# Patient Record
Sex: Female | Born: 1950 | Race: White | Hispanic: No | Marital: Married | Smoking: Current every day smoker
Health system: Southern US, Community
[De-identification: ages and names within clinical notes are randomized; demographics above are authoritative.]

## PROBLEM LIST (undated history)

## (undated) DIAGNOSIS — Z9889 Other specified postprocedural states: Secondary | ICD-10-CM

## (undated) DIAGNOSIS — R112 Nausea with vomiting, unspecified: Secondary | ICD-10-CM

## (undated) DIAGNOSIS — I10 Essential (primary) hypertension: Secondary | ICD-10-CM

## (undated) DIAGNOSIS — G709 Myoneural disorder, unspecified: Secondary | ICD-10-CM

## (undated) DIAGNOSIS — J189 Pneumonia, unspecified organism: Secondary | ICD-10-CM

## (undated) HISTORY — PX: TONSILLECTOMY: SUR1361

## (undated) HISTORY — PX: APPENDECTOMY: SHX54

---

## 1972-01-31 HISTORY — PX: TUBAL LIGATION: SHX77

## 1974-01-30 HISTORY — PX: ABDOMINAL HYSTERECTOMY: SHX81

## 1997-10-08 ENCOUNTER — Encounter: Payer: Self-pay | Admitting: Family Medicine

## 1997-10-08 ENCOUNTER — Ambulatory Visit (HOSPITAL_COMMUNITY): Admission: RE | Admit: 1997-10-08 | Discharge: 1997-10-08 | Payer: Self-pay | Admitting: Family Medicine

## 1997-10-16 ENCOUNTER — Encounter: Payer: Self-pay | Admitting: Family Medicine

## 1997-10-16 ENCOUNTER — Ambulatory Visit (HOSPITAL_COMMUNITY): Admission: RE | Admit: 1997-10-16 | Discharge: 1997-10-16 | Payer: Self-pay | Admitting: Family Medicine

## 1998-07-15 ENCOUNTER — Ambulatory Visit (HOSPITAL_BASED_OUTPATIENT_CLINIC_OR_DEPARTMENT_OTHER): Admission: RE | Admit: 1998-07-15 | Discharge: 1998-07-15 | Payer: Self-pay | Admitting: Orthopedic Surgery

## 1999-01-31 HISTORY — PX: CHOLECYSTECTOMY: SHX55

## 1999-06-09 ENCOUNTER — Encounter: Payer: Self-pay | Admitting: Family Medicine

## 1999-06-09 ENCOUNTER — Encounter: Payer: Self-pay | Admitting: General Surgery

## 1999-06-09 ENCOUNTER — Inpatient Hospital Stay (HOSPITAL_COMMUNITY): Admission: EM | Admit: 1999-06-09 | Discharge: 1999-06-10 | Payer: Self-pay | Admitting: Emergency Medicine

## 1999-06-09 ENCOUNTER — Encounter (INDEPENDENT_AMBULATORY_CARE_PROVIDER_SITE_OTHER): Payer: Self-pay | Admitting: Specialist

## 1999-06-09 ENCOUNTER — Ambulatory Visit (HOSPITAL_COMMUNITY): Admission: RE | Admit: 1999-06-09 | Discharge: 1999-06-09 | Payer: Self-pay | Admitting: Family Medicine

## 1999-06-24 ENCOUNTER — Encounter: Payer: Self-pay | Admitting: General Surgery

## 1999-06-24 ENCOUNTER — Encounter: Admission: RE | Admit: 1999-06-24 | Discharge: 1999-06-24 | Payer: Self-pay | Admitting: General Surgery

## 1999-10-18 ENCOUNTER — Encounter: Payer: Self-pay | Admitting: Family Medicine

## 1999-10-18 ENCOUNTER — Encounter: Admission: RE | Admit: 1999-10-18 | Discharge: 1999-10-18 | Payer: Self-pay | Admitting: Family Medicine

## 2000-01-31 HISTORY — PX: BREAST SURGERY: SHX581

## 2001-01-30 HISTORY — PX: BREAST EXCISIONAL BIOPSY: SUR124

## 2001-04-23 ENCOUNTER — Encounter: Payer: Self-pay | Admitting: Family Medicine

## 2001-04-23 ENCOUNTER — Encounter: Admission: RE | Admit: 2001-04-23 | Discharge: 2001-04-23 | Payer: Self-pay | Admitting: Family Medicine

## 2001-04-23 ENCOUNTER — Encounter (INDEPENDENT_AMBULATORY_CARE_PROVIDER_SITE_OTHER): Payer: Self-pay | Admitting: Specialist

## 2001-04-26 ENCOUNTER — Encounter (INDEPENDENT_AMBULATORY_CARE_PROVIDER_SITE_OTHER): Payer: Self-pay | Admitting: *Deleted

## 2001-04-26 ENCOUNTER — Ambulatory Visit (HOSPITAL_BASED_OUTPATIENT_CLINIC_OR_DEPARTMENT_OTHER): Admission: RE | Admit: 2001-04-26 | Discharge: 2001-04-26 | Payer: Self-pay | Admitting: General Surgery

## 2001-04-30 HISTORY — PX: BREAST BIOPSY: SHX20

## 2002-03-21 ENCOUNTER — Encounter (INDEPENDENT_AMBULATORY_CARE_PROVIDER_SITE_OTHER): Payer: Self-pay

## 2002-03-21 ENCOUNTER — Ambulatory Visit (HOSPITAL_BASED_OUTPATIENT_CLINIC_OR_DEPARTMENT_OTHER): Admission: RE | Admit: 2002-03-21 | Discharge: 2002-03-21 | Payer: Self-pay | Admitting: General Surgery

## 2002-04-29 ENCOUNTER — Encounter: Payer: Self-pay | Admitting: General Surgery

## 2002-04-29 ENCOUNTER — Encounter: Admission: RE | Admit: 2002-04-29 | Discharge: 2002-04-29 | Payer: Self-pay | Admitting: General Surgery

## 2003-01-31 HISTORY — PX: BACK SURGERY: SHX140

## 2003-02-03 ENCOUNTER — Ambulatory Visit (HOSPITAL_COMMUNITY): Admission: RE | Admit: 2003-02-03 | Discharge: 2003-02-03 | Payer: Self-pay | Admitting: Orthopedic Surgery

## 2003-02-03 ENCOUNTER — Ambulatory Visit (HOSPITAL_BASED_OUTPATIENT_CLINIC_OR_DEPARTMENT_OTHER): Admission: RE | Admit: 2003-02-03 | Discharge: 2003-02-03 | Payer: Self-pay | Admitting: Orthopedic Surgery

## 2003-07-26 ENCOUNTER — Ambulatory Visit (HOSPITAL_COMMUNITY): Admission: RE | Admit: 2003-07-26 | Discharge: 2003-07-26 | Payer: Self-pay | Admitting: Family Medicine

## 2003-12-22 ENCOUNTER — Encounter: Admission: RE | Admit: 2003-12-22 | Discharge: 2003-12-22 | Payer: Self-pay | Admitting: Neurological Surgery

## 2004-01-14 ENCOUNTER — Inpatient Hospital Stay (HOSPITAL_COMMUNITY): Admission: RE | Admit: 2004-01-14 | Discharge: 2004-01-15 | Payer: Self-pay | Admitting: Neurological Surgery

## 2004-01-29 ENCOUNTER — Inpatient Hospital Stay (HOSPITAL_COMMUNITY): Admission: AD | Admit: 2004-01-29 | Discharge: 2004-02-04 | Payer: Self-pay | Admitting: Neurological Surgery

## 2004-03-15 ENCOUNTER — Encounter: Admission: RE | Admit: 2004-03-15 | Discharge: 2004-03-15 | Payer: Self-pay | Admitting: Neurological Surgery

## 2004-03-19 ENCOUNTER — Encounter: Admission: RE | Admit: 2004-03-19 | Discharge: 2004-03-19 | Payer: Self-pay | Admitting: Neurological Surgery

## 2004-04-22 ENCOUNTER — Encounter: Admission: RE | Admit: 2004-04-22 | Discharge: 2004-04-22 | Payer: Self-pay | Admitting: General Surgery

## 2004-05-17 ENCOUNTER — Encounter: Admission: RE | Admit: 2004-05-17 | Discharge: 2004-05-17 | Payer: Self-pay | Admitting: Neurological Surgery

## 2004-10-10 ENCOUNTER — Encounter: Admission: RE | Admit: 2004-10-10 | Discharge: 2004-10-10 | Payer: Self-pay | Admitting: Neurological Surgery

## 2004-10-14 ENCOUNTER — Encounter: Admission: RE | Admit: 2004-10-14 | Discharge: 2004-10-14 | Payer: Self-pay | Admitting: Neurological Surgery

## 2005-01-27 ENCOUNTER — Encounter: Admission: RE | Admit: 2005-01-27 | Discharge: 2005-01-27 | Payer: Self-pay | Admitting: Neurological Surgery

## 2005-02-15 ENCOUNTER — Inpatient Hospital Stay (HOSPITAL_COMMUNITY): Admission: RE | Admit: 2005-02-15 | Discharge: 2005-02-17 | Payer: Self-pay | Admitting: Neurological Surgery

## 2005-03-28 ENCOUNTER — Encounter: Admission: RE | Admit: 2005-03-28 | Discharge: 2005-03-28 | Payer: Self-pay | Admitting: Neurological Surgery

## 2005-05-01 ENCOUNTER — Encounter: Admission: RE | Admit: 2005-05-01 | Discharge: 2005-05-01 | Payer: Self-pay | Admitting: General Surgery

## 2005-06-20 ENCOUNTER — Encounter: Admission: RE | Admit: 2005-06-20 | Discharge: 2005-06-20 | Payer: Self-pay | Admitting: Neurological Surgery

## 2006-02-19 ENCOUNTER — Encounter: Admission: RE | Admit: 2006-02-19 | Discharge: 2006-02-19 | Payer: Self-pay | Admitting: Neurological Surgery

## 2006-06-05 ENCOUNTER — Encounter: Admission: RE | Admit: 2006-06-05 | Discharge: 2006-06-05 | Payer: Self-pay | Admitting: Family Medicine

## 2006-06-13 ENCOUNTER — Encounter: Admission: RE | Admit: 2006-06-13 | Discharge: 2006-06-13 | Payer: Self-pay | Admitting: Family Medicine

## 2006-07-21 ENCOUNTER — Encounter: Admission: RE | Admit: 2006-07-21 | Discharge: 2006-07-21 | Payer: Self-pay | Admitting: Neurological Surgery

## 2007-03-11 ENCOUNTER — Encounter: Admission: RE | Admit: 2007-03-11 | Discharge: 2007-03-11 | Payer: Self-pay | Admitting: Neurological Surgery

## 2007-03-22 ENCOUNTER — Encounter: Admission: RE | Admit: 2007-03-22 | Discharge: 2007-03-22 | Payer: Self-pay | Admitting: Neurological Surgery

## 2007-08-06 ENCOUNTER — Encounter: Admission: RE | Admit: 2007-08-06 | Discharge: 2007-08-06 | Payer: Self-pay | Admitting: Family Medicine

## 2007-08-26 ENCOUNTER — Encounter: Admission: RE | Admit: 2007-08-26 | Discharge: 2007-08-26 | Payer: Self-pay | Admitting: Anesthesiology

## 2007-10-31 ENCOUNTER — Encounter: Admission: RE | Admit: 2007-10-31 | Discharge: 2007-10-31 | Payer: Self-pay | Admitting: Neurological Surgery

## 2007-11-13 ENCOUNTER — Encounter: Admission: RE | Admit: 2007-11-13 | Discharge: 2007-11-13 | Payer: Self-pay | Admitting: Neurological Surgery

## 2008-09-08 ENCOUNTER — Encounter: Admission: RE | Admit: 2008-09-08 | Discharge: 2008-09-08 | Payer: Self-pay | Admitting: Neurological Surgery

## 2008-09-21 ENCOUNTER — Encounter: Admission: RE | Admit: 2008-09-21 | Discharge: 2008-09-21 | Payer: Self-pay | Admitting: Family Medicine

## 2008-10-29 ENCOUNTER — Ambulatory Visit (HOSPITAL_COMMUNITY): Admission: RE | Admit: 2008-10-29 | Discharge: 2008-10-30 | Payer: Self-pay | Admitting: Neurological Surgery

## 2008-12-07 ENCOUNTER — Encounter: Admission: RE | Admit: 2008-12-07 | Discharge: 2008-12-07 | Payer: Self-pay | Admitting: Neurological Surgery

## 2008-12-14 ENCOUNTER — Encounter: Admission: RE | Admit: 2008-12-14 | Discharge: 2008-12-14 | Payer: Self-pay | Admitting: Family Medicine

## 2009-02-08 ENCOUNTER — Encounter: Admission: RE | Admit: 2009-02-08 | Discharge: 2009-02-08 | Payer: Self-pay | Admitting: Neurological Surgery

## 2009-03-29 ENCOUNTER — Encounter: Admission: RE | Admit: 2009-03-29 | Discharge: 2009-03-29 | Payer: Self-pay | Admitting: Family Medicine

## 2009-09-23 ENCOUNTER — Encounter: Admission: RE | Admit: 2009-09-23 | Discharge: 2009-09-23 | Payer: Self-pay | Admitting: Family Medicine

## 2009-10-25 ENCOUNTER — Encounter: Admission: RE | Admit: 2009-10-25 | Discharge: 2009-10-25 | Payer: Self-pay | Admitting: Unknown Physician Specialty

## 2010-02-20 ENCOUNTER — Encounter: Payer: Self-pay | Admitting: Orthopedic Surgery

## 2010-03-15 ENCOUNTER — Other Ambulatory Visit: Payer: Self-pay | Admitting: Neurological Surgery

## 2010-03-15 DIAGNOSIS — M545 Low back pain, unspecified: Secondary | ICD-10-CM

## 2010-03-17 ENCOUNTER — Ambulatory Visit
Admission: RE | Admit: 2010-03-17 | Discharge: 2010-03-17 | Disposition: A | Discharge status: Home / Self Care | Payer: Self-pay | Source: Ambulatory Visit | Attending: Neurological Surgery | Admitting: Neurological Surgery

## 2010-03-17 DIAGNOSIS — M545 Low back pain, unspecified: Secondary | ICD-10-CM

## 2010-03-17 MED ORDER — GADOBENATE DIMEGLUMINE 529 MG/ML IV SOLN
17.0000 mL | Freq: Once | INTRAVENOUS | Status: AC | PRN
  Administered 2010-03-17: 17 mL via INTRAVENOUS

## 2010-05-06 LAB — DIFFERENTIAL
Basophils Absolute: 0 10*3/uL (ref 0.0–0.1)
Eosinophils Relative: 4 % (ref 0–5)
Lymphocytes Relative: 26 % (ref 12–46)

## 2010-05-06 LAB — PROTIME-INR: INR: 0.9 (ref 0.00–1.49)

## 2010-05-06 LAB — APTT: aPTT: 29 seconds (ref 24–37)

## 2010-05-06 LAB — COMPREHENSIVE METABOLIC PANEL
Albumin: 3.7 g/dL (ref 3.5–5.2)
BUN: 4 mg/dL — ABNORMAL LOW (ref 6–23)
Creatinine, Ser: 0.92 mg/dL (ref 0.4–1.2)
Total Bilirubin: 0.7 mg/dL (ref 0.3–1.2)
Total Protein: 6.5 g/dL (ref 6.0–8.3)

## 2010-05-06 LAB — CBC
Hemoglobin: 13.7 g/dL (ref 12.0–15.0)
MCHC: 33.9 g/dL (ref 30.0–36.0)

## 2010-06-17 NOTE — Op Note (Signed)
   NAME:  [name], [name]                       ACCOUNT NO.:  [id number]   MEDICAL RECORD NO.:  [id number]                   PATIENT TYPE:  AMB   LOCATION:  DSC                                  FACILITY:  [facility]   PHYSICIAN:  Rose Phi. Maple Hudson, M.D.                DATE OF BIRTH:  1950/07/12   DATE OF PROCEDURE:  03/21/2002  DATE OF DISCHARGE:                                 OPERATIVE REPORT   PREOPERATIVE DIAGNOSIS:  Sebaceous cyst of the right axilla.   POSTOPERATIVE DIAGNOSIS:  Sebaceous cyst of the right axilla.   PROCEDURE:  Excision of sebaceous cyst of the right axilla.   SURGEON:  Rose Phi. Maple Hudson, M.D.   ANESTHESIA:  Local.   DESCRIPTION OF PROCEDURE:  This patient had a painful, tender sebaceous cyst  of the right axilla.  We prepped and draped it and then under local  anesthesia I excised it.  Wound closed with 4-0 nylon.  Dressing applied.  The patient sent home doing well.                                               Rose Phi. Maple Hudson, M.D.    PRY/MEDQ  D:  03/21/2002  T:  03/21/2002  Job:  [id number]

## 2010-06-17 NOTE — Op Note (Signed)
[facility]  Patient:    [name], [name]                    MRN: [id number] Proc. Date: 06/09/99 Adm. Date:  [id number] Disc. Date: [id number] Attending:  Carson Myrtle CC:         Meredith Staggers, M.D.                           Operative Report  PREOPERATIVE DIAGNOSIS:  Acute cholecystolithiasis.  POSTOPERATIVE DIAGNOSIS:  Acute cholecystolithiasis.  PROCEDURE:  Laparoscopic cholecystectomy.  SURGEON:  Timothy E. Earlene Plater, M.D.  ASSISTANT:  Abigail Miyamoto, M.D.  ANESTHESIA:  CRNA-supervised, Lucille Passy, M.D.  INDICATIONS:  [name] was seen this afternoon.  She is a 61 year old Caucasian female referred by Dr. Arsenio Loader with acute abdominal pain. Laboratory data are normal.  Ultrasound shows a thickened gallbladder, acutely inflamed with pericholecystic fluid and stones in the gallbladder.  She agrees to proceed with a laparoscopic cholecystectomy.  DESCRIPTION OF PROCEDURE:  The patient was taken to the operating room. General endotracheal anesthesia was administered.  The abdomen was scrubbed, prepped, and draped in the usual fashion.  Marcaine, 0.5%, with epinephrine was used for each trocar site.  A vertical incision was made at the inferior aspect of the umbilicus.  The fascia was identified and opened vertically in the midline.  The peritoneum was entered without complication.  The ______ catheter was tied in place.  The abdomen was insufflated and camera was introduced.  The gallbladder was acutely inflamed and tense.  The remainder of the abdomen was grossly within normal limits.  A second 10-mm trocar was placed in the mid epigastrium and two 5-mm trocars in the right upper quadrant.  The gallbladder was grasped, punctured, and suctioned, and then placed on tension.  The adhesions of the duodenum and omentum were taken down bluntly without cautery.  The base of the gallbladder was dissected completely.  The  cystic duct was anterior; it was dissected out, triply clipped and divided.  Two branches of the cystic artery were dissected out, entering the gallbladder, triply clipped and divided.  The gallbladder was then removed from the liver bed without complication although there was considerable edema.  The bed was dry.  There was no blood or bile.  The irrigation was clear.  The gallbladder was removed through the infraumbilical incision, and that was tied closed.  Inspection was negative.  All irrigant seal, two instruments and trocars were removed.  Skin incision was closed with a 4-0 Monocryl.  Steri-Strips were applied.  She tolerated it well.  Final counts were correct, and she was removed to recovery room in good condition. DD:  06/09/99 TD:  06/13/99 Job: [id number]

## 2010-06-17 NOTE — Op Note (Signed)
NAME:  [name], [name]                       ACCOUNT NO.:  [id number]   MEDICAL RECORD NO.:  [id number]                   PATIENT TYPE:  AMB   LOCATION:  DSC                                  FACILITY:  [facility]   PHYSICIAN:  Robert A. Thurston Hole, M.D.              DATE OF BIRTH:  [birth year]   DATE OF PROCEDURE:  02/03/2003  DATE OF DISCHARGE:                                 OPERATIVE REPORT   PREOPERATIVE DIAGNOSES:  Left shoulder partial rotator cuff tear with  impingement and acromioclavicular joint arthropathy.   POSTOPERATIVE DIAGNOSES:  Left shoulder partial rotator cuff tear with  impingement and acromioclavicular joint arthropathy.   PROCEDURE:  1. Left shoulder examination under anesthesia, followed by an arthroscopic     partial rotator cuff tear debridement.  2. Left shoulder subacromial decompression.  3. Left shoulder distal clavicle excision.   SURGEON:  Elana Alm. Thurston Hole, M.D.   ASSISTANT:  Julien Girt, P.A.   ANESTHESIA:  General.   OPERATIVE TIME:  Was 40 minutes.   COMPLICATIONS:  None.   INDICATIONS FOR PROCEDURE:  The patient is a 60 year old woman who has had  10-12 months of increasing left shoulder pain with an examination and MRI  documenting a partial rotator cuff tear with impingement and AC joint  arthropathy.  She has failed conservative care and is now to undergo an  arthroscopy.   DESCRIPTION OF PROCEDURE:  The patient was brought to the operating room on  February 03, 2003, after an inter-Scalene block had been placed in the  holding room.  She was placed on the operating room table in the supine  position.  After being placed under general anesthesia, her left shoulder  was examined under anesthesia.  She had a full range of motion and her  shoulder was stable to ligamentous exam.  She was then placed in the beach  chair position, and her shoulder and arm were prepped using sterile DuraPrep  and draped using a sterile technique.  Originally  through a posterior  arthroscopic portal, the arthroscope with a pump attached was placed, and  through an anterior an arthroscopic probe was placed.  On initial inspection  the articular cartilage and glenohumeral joint were intact.  The anterior  and posterior labrum were intact.  The inferior labrum and anterior inferior  glenohumeral ligament complex was intact.  The superior labrum and biceps  tendon anchor were intact, and the biceps tendon was intact.  The rotator  cuff showed a small partial tear, 20% of the undersurface of the  supraspinatus which was debrided.  The rest of the rotator cuff was intact.  The inferior capsular recess was free of pathology.  The subacromial space  was entered and a lateral arthroscopic portal was made.  Moderately  thickened bursitis was resected.  The rotator cuff underneath this was  thickened and inflamed, but no evidence of tear.  Impingement was noted, and  a subacromial decompression was carried out as well as a CA  ligament  release.  The [facility] joint showed significant spurring and synovitis and  inflammation, and the distal 5.0 to 6.0 mm of the clavicle was resected with  a 6.0 mm bur.  After this was done, the shoulder could be brought through a  full range of motion with no impingement on the rotator cuff.  At this point  it was felt that all pathology had been satisfactorily addressed.  The  instruments were removed.  The portals were closed with #3-0 nylon suture  and injected with 0.25% Marcaine with epinephrine.  Sterile dressings and a  sling were applied.  The patient was awakened and taken to the recovery room in stable condition.   FOLLOW-UP CARE:  The patient will be followed as an outpatient on Mepergan  Fortis and Naprosyn, with early physical therapy.  I will see her back in  the office in one week for sutures out and followup.                                               Robert A. Thurston Hole, M.D.    RAW/MEDQ  D:  02/03/2003   T:  02/03/2003  Job:  [id number]

## 2010-06-17 NOTE — Op Note (Signed)
[name], [name] NO.:  [id number]   MEDICAL RECORD NO.:  [id number]          PATIENT TYPE:  INP   LOCATION:  2899                         FACILITY:  [facility]   PHYSICIAN:  Tia Alert, MD     DATE OF BIRTH:  1950/07/01   DATE OF PROCEDURE:  01/14/2004  DATE OF DISCHARGE:                                 OPERATIVE REPORT   PREOPERATIVE DIAGNOSES:  1.  Degenerative joint disease, L4-5.  2.  Lumbar spinal stenosis, L4-5.  3.  Back pain.  4.  Left leg pain.   POSTOPERATIVE DIAGNOSES:  1.  Degenerative joint disease, L4-5.  2.  Lumbar spinal stenosis, L4-5.  3.  Back pain.  4.  Left leg pain.   PROCEDURES:  1.  Decompressive lumbar hemilaminectomy, hemifacetectomy, L4-5 on the left,      with sublaminar decompression for spinal stenosis.  2.  Transforaminal lumbar interbody fusion, L4-5, utilizing a 12 x 25 mm      PEEK interbody cage packed with local autograft and Grafton putty for      degenerative joint disease.  3.  Posterolateral fusion, L4-5 utilizing Grafton putty and local autograft.  4.  Nonsegmental fixation, L4-5, utilizing the Nuvasive pedicle screw system      on the left and a trans-facet screw on the right.   SURGEON:  Tia Alert, M.D.   ASSISTANT:  Reinaldo Meeker, M.D.   ANESTHESIA:  General endotracheal.   COMPLICATIONS:  None apparent.   INDICATION FOR PROCEDURE:  [name] is a 60 year old white female who  was referred to the neurosurgery clinic with complaints of severe back pain.  She also had left leg pain.  She had an MRI and a CT scan, which showed  spinal stenosis at L4-5 with degenerative joint disease.  She had tried  medical management for quite some time without significant relief.  We  discussed the options.  One option was a transforaminal lumbar interbody  fusion with nonsegmental instrumentation.  She had failed all other medical  management.  She understood the risks, the benefits, and the expected  outcome of the procedure and wished to proceed.   DESCRIPTION OF PROCEDURE:  The patient was taken to the operating room and  after the induction of adequate generalized endotracheal anesthesia, she was  rolled into the prone position on the Wilson frame and all pressure points  were padded.  Her lumbar region was prepped with the DuraPrep and then  draped in the usual sterile fashion.  Local anesthesia 10 mL was injected  and then a dorsal midline incision was made and carried down to the  lumbosacral fascia.  The fascia was opened and the paraspinous musculature  was taken down in a subperiosteal fashion to expose the L4-5 interspace  along with the facets.  The transverse processes were identified on the  patient's left side.  Intraoperative fluoroscopy confirmed our level, and  then the spinous process was removed with a Leksell rongeur and then a  hemilaminectomy, hemifacetectomy, and foraminotomy was performed at L4-5 on  the left side.  A small dural  tear was created underneath the superior part  of the lamina at L5, and this was sewn primarily with a 6-0 Prolene suture.  We then finished the decompression.  The L4 and L5 nerve roots were  identified on the left side.  I performed a sublaminar decompression to  identify the L5 nerve root on the patient's right side also.  The epidural  venous vasculature was coagulated and the dura was retracted and the disk  was incised with a 15 blade scalpel and the initial diskectomy was done with  pituitary rongeurs.  I then used a combination of curettes and scrapers to  perform a near-total diskectomy at L4-5, then used the 12 mm distractor and  found this to be the correct size using fluoroscopy and the feel of the  distractor in the disk space.  Then chiseled the disk space with a 12 mm  chisel to cut the end plates and to prepare the end plates for fusion.  We  then packed the disk space with a mixture of local autograft and Grafton   putty.  We then used a 12 x 25 mm PEEK interbody cage and packed this with  local autograft and Grafton putty and tapped it in position transforaminal  until it spanned the midline.  We then checked this under fluoroscopy.  We  then turned our attention to the nonsegmental fixation.  I localized the  pedicle screw entry zones at L4 and L5 on the patient's left side and probed  each pedicle, tapped each pedicle, and then placed a 6.5 x 45 mm pedicle  screw into the L4 pedicle and a 6.5 x 40 mm pedicle screw into the L5  pedicle, checked these under fluoroscopy, and then placed a lordotic rod  into the multiaxial screw heads and locked these into position after  achieving compression on the graft.  We then turned our attention to the  trans-facet screw and utilizing a combination of AP and lateral fluoroscopy,  we were able to drill from the facet at L4-5 on the right into the pedicle  at L5 on the right.  EMG monitoring was used and once the drill hole was  placed, we tested this with EMG monitoring.  We could also palpate the  medial pedicle wall at L5 on the right side.  We measured the screw to be 35  mm and using a K-wire placed a 35 mm trans-facet screw in on the right side  and then tested it once again with EMG monitoring.  We then irrigated with  copious amounts of bacitracin-containing saline solution, lined the dural  tear with Duragen and Tisseel fibrin glue and then Gelfoam and then drilled  the posterior elements at L4-5 on the right side and placed a mixture of  local autograft and Grafton putty out over these.  This was all soaked in  bone marrow aspirate from the bleeding from the trans-facet drilling.  We  then removed the retractors, dried all bleeding points, and then closed the  muscle and then the fascia with interrupted #1 Vicryl, closed the  subcutaneous and subcuticular tissue with 2-0 and 3-0 Vicryl, and closed the skin with Benzoin and Steri-Strips.  The drapes were  removed, a sterile  dressing was applied, and the patient was awakened from general anesthesia  and transported to the recovery room in stable condition.  At the end of the  procedure all sponge, needle, and instrument counts were correct.      Pervis Hocking  DSJ/MEDQ  D:  01/14/2004  T:  01/14/2004  Job:  [id number]

## 2010-06-17 NOTE — Discharge Summary (Signed)
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   PROCEDURES:  1.  Lumbar reexploration with repair of pseudomeningocele secondary to      cerebral spinal fluid leak.  2.  Lumbar drain placement.   HISTORY OF PRESENT ILLNESS:  [name] is a 60 year old, white female who  underwent surgery at L4-L5 15 days prior to admission.  She developed  postural headaches, fever and drainage of clear fluid from her incision.  We  got an MRI which showed a large pseudomeningocele.  She was admitted for  lumbar exploration and repair of the pseudomeningocele.   HOSPITAL COURSE:  The patient was admitted on January 29, 2004.  She was  taken to the operating room where she underwent a lumbar reexploration with  repair of a pseudomeningocele.  She tolerated the procedure well and was  taken to recovery room and then to the floor in stable condition.  She was  started on vancomycin and Rocephin for presumed meningitis.  Her spinal  fluid was sent from the operating room and had no growth of bacteria.  Protein cell count and differential were not felt to be consistent with  infectious numbers.  However, she remained on the vancomycin and Rocephin.  About 12 hours after surgery, she was leaking clear fluid from her incision  once again.  We took her back to the operating room and explored her once  again.  The previous repair site looked quite good and there was only a slow  leakage from around the suture site.  Therefore, I sewed a piece of fat  graft over this and placed a lumbar drain through a separate  stab incision  then reclosed.  The patient did tolerate this procedure well also.  She was  taken to the recovery room and then to the floor for stable condition.  For  details of these operative procedures, please see the dictated operative  notes in the chart.   The patient remained at bed rest with a lumbar drain in place draining 10-15  cc an hour x4 days.  She remained afebrile.  She remained on her vancomycin  and Rocephin.  She had a Dilaudid PCA for pain control.  On postop day #5,  she was allowed out of bed and her lumbar drain was clamped.  She did have  low pressure headaches at first, but these resolved over about 24 hours.  Her lumbar drain was removed and she continued to do well without headaches.  She did have some mild nausea, but no significant postural headaches.  Her  pain was well-controlled on oral pain medications.  She was tolerating a  regular diet and she  was discharged to home in stable condition on February 04, 2004.  Her incision looked quite good and plan was to have the sutures  removed 4 days from discharge.   DISCHARGE MEDICATIONS:  1.  Demerol and Lorcet at home which she will use for pain.  2.  Continue Effexor as at home.  3.  Ativan and Restoril as at home.   SPECIAL INSTRUCTIONS:  The patient was given discharge instructions and  demonstrated understanding of all instructions.  She also had all of her  questions answered to her satisfaction.   DISPOSITION:  To home.   DISCHARGE DIAGNOSIS:  Repair of pseudomeningocele.      Markus.Osmond   DSJ/MEDQ  D:  02/04/2004  T:  02/04/2004  Job:  [id number]

## 2010-06-17 NOTE — H&P (Signed)
NAMEFAELYN, SIGLER             ACCOUNT NO.:  [id number]   MEDICAL RECORD NO.:  [id number]          PATIENT TYPE:  INP   LOCATION:  3023                         FACILITY:  [facility]   PHYSICIAN:  Tia Alert, MD     DATE OF BIRTH:  1950/11/28   DATE OF ADMISSION:  01/29/2004  DATE OF DISCHARGE:                                HISTORY & PHYSICAL   CHIEF COMPLAINT:  CSF leakage.   HISTORY OF PRESENT ILLNESS:  Ms. Marten is a 60 year old white female who  underwent a T-lift at L4-5 approximately 15 days ago. At the time of surgery  she had a small dural tear which was repaired primarily and then covered  with Duragen and CoSeal fibrin glue.  Over the last 2 to 3 days she has  developed significant postural headaches with drainage of occasional small  amounts of clear fluid from the inferior part of her incision. She has also  had some fever up to 102.2.  She has had no increase in her back pain or leg  pain, and no leg weakness. She saw me in the office where I noticed the CSF  leak and admitted her for MRI scanning and repair of the CSF leak.   PAST MEDICAL HISTORY:  As above, also rotator cuff repair; cholecystectomy;  breast surgery.   MEDICATIONS:  1.  Lorcet.  2.  Effexor.  3.  Lorazepam.  4.  Promethazine.   ALLERGIES:  STADOL, SULFA, AND CODEINE.   SOCIAL HISTORY:  She smokes about 1/2 pack per day and denies any alcohol  use.   FAMILY HISTORY:  Positive for colon cancer, breast cancer, hypertension and  heart disease.   PHYSICAL EXAMINATION:  GENERAL:  Awake, alert and oriented x4. No aphasia,  good attention span. She has good strength in her lower extremities, good  muscle tone and good muscle bulk. She ambulates well.  NECK:  She has no nuchal rigidity.  NEURO:  She ambulates easily, sensation is intact.  BACK:  Her incision does show a small 2 to 3 mm area in the inferior part of  the incision with some fibrinous exudate and when she stands for more than 5  minutes it does leak a droplet of clear fluid suspected to be CSF.   ASSESSMENT AND PLAN:  We are going to admit her for MRI imaging with  contrast to rule out a CSF leaking pseudomeningocele. Of she has a large  pseudomeningocele would likely undergo surgical repair of this.  We will  send CSF samples to rule out meningitis and start her empirically on  vancomycin and Rocephin until the CSF samples have returned.      Pervis Hocking   DSJ/MEDQ  D:  01/29/2004  T:  01/29/2004  Job:  [id number]

## 2010-06-17 NOTE — Op Note (Signed)
[place]  Patient:    [name], [name] Visit Number: [id number] MRN: [id number]          Service Type: DSU Location: [facility] Attending Physician:  Janalyn Rouse Dictated by:   Rose Phi. Maple Hudson, M.D. Proc. Date: 04/26/01 Admit Date:  04/26/2001 Discharge Date: 04/26/2001                             Operative Report  PREOPERATIVE DIAGNOSIS: Left breast mass, to rule out malignancy.  POSTOPERATIVE DIAGNOSIS: Left breast mass, to rule out malignancy.  OPERATION: Excision of right breast mass.  SURGEON: Rose Phi. Maple Hudson, M.D.  ANESTHESIA: MAC.  OPERATIVE PROCEDURE: This patient had presented with a vague mass at the left breast, which on mammogram and ultrasound showed just some vague changes, and my concern was whether she may have lobular carcinoma. Core biopsies had failed to reveal any abnormalities so we are now excising the mass.  The patient was placed on the operating table, the left breast extended on the arm board. The left breast was prepped and draped in the usual fashion. A curved incision overlying the palpable mass centered at about the 2:30 to 3 oclock position was then outlined and the area infiltrated with 1% Xylocaine with adrenaline. Incision was made and this area was excised. The tissue was very dense and fibrotic, but I really did not see anything that looked obviously malignant. Hemostasis was obtained with cautery. Subcuticular closure of 4-0 Monocryl and Steri-Strips carried out. Dressing applied. The patient was transferred to the recovery room in satisfactory condition having tolerated the procedure well. Dictated by:   Rose Phi. Maple Hudson, M.D. Attending Physician:  Janalyn Rouse DD:  04/26/01 TD:  04/28/01 Job: [id number]

## 2010-06-17 NOTE — Op Note (Signed)
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   PROCEDURES:  1.  Lumbar re-exploration with exploration of fusion L4-5, followed by      removal of transverse facette screw L4-5 on the right.  2.  Lumbar partial hemilaminectomy L4-5 on the right with partial half      facetectomy L4-5 on the right.  3.  Intertransverse arthrodesis L4-5 on the right utilizing BMP soaked      sponges mixed with local autograft and VITOSS soaked with a bone marrow      aspirate obtained through a separate stab incision over the left iliac      crest.  4.  Bone marrow aspirate from the left iliac crest through a separate      fascial incision.  5.  Nonsegmental fixation L4-5 on the right utilizing the spinal concepts      pedicle screw system.   SURGEON:  Dr. Marikay Alar   ASSISTANT:  Donalee Citrin, M.D.   ANESTHESIA:  General endotracheal.   COMPLICATIONS:  None apparent.   INDICATIONS FOR PROCEDURE:  [name] is a very pleasant 60 year old  female who underwent a T-lift at L4-5 about 13 months ago. She had the  pedicle screws placed on the left and a transverse facet screw placed on the  right with an Onlay fusion at L4-5 on the right side. She did well for  several months and then had progressively worsening back pain with left leg  pain. She had a CT myelogram which showed an excellent decompression and  excellent filling of the nerve roots; however, I felt that she had a  pseudoarthrosis. I found very little bone growth through the cage in the  interbody  space, though there seemed to be some bone graft still there;  however, most of the Onlay fusion material that I saw had failed to fuse;  and I felt that there was very minor motion on flexion/extension views I  recommended a lumbar re-exploration with the removal of the facette screw  and the placement of pedicle screws, followed by repeat fusion with BMP. She  understood the risks, the benefits, and the expected outcome of procedure  and wished to proceed.   FINDINGS AT OPERATION:  The bone graft material at L4-5 on the right side  which was an Onlay fusion over the lamina and facette complex had failed to  fuse most of it was unidentifiable at the time of surgery. The hardware  seemed to be in good position and was quite solid and tight; and seemed to  move as a unit on the patient's left side.  DESCRIPTION OF THE PROCEDURE:  The patient was taken to the operating room  and after induction of adequate generalized endotracheal anesthesia, she was  rolled into the prone position and all chest rolls and all pressure points  were padded. The lumbar region was prepped with DuraPrep and then draped in  the usual sterile fashion. Then 10 mL of local anesthesia was injected; and  then an incision was made through the old incision and then carried down  through the lumbosacral fascia. The spinous processes of L3-L5 were  identified and the lamina exposed.  I then dissected through the scar tissue  to find the remaining lamina at L4-5 on the right side.  I dissected out  over the facette complex to identify the transverse facet screw.  I then  continued to dissect out over this to identify the L4 and L5 transverse  processes  These I cleaned off to prepare for later arthrodesis.  I then  dissected out over the pedicle screws at L4-5 on the patient's left side.  These were tested with a Leksell rongeur. They seemed to have good solid  fixation. Therefore, we felt it was best to leave those  instead of replacing  the locking caps, though we knew that they could fatigue over time. We  turned our attention to the decompression on the right side. I was able to  remove some of the lamina on the right side and decompress the canal,  somewhat, from the right side. A medial facetectomy was also performed. We  then saved this autograft.  I also dissected, superfascially and opened the  fascia through a separate incision over the patient's left iliac crest and  retrieved 10 mL of bone marrow aspirate which I soaked on VITOSS for later  arthrodesis.   I then localized the pedicle screw entry zones at L4 and L5 on the right  side; and under fluoroscopic guidance, probed each pedicle with a pedicle  probe, tapped each pedicle with a 6-0 tap, palpated each pedicle with a ball  probe to assure no break at the cortex of the pedicle and then placed 65 x  45 mm pedicle screws into the L4 and L5 pedicles on the patient's right  side. We then decorticated the transverse processes and placed a mixture of  BMP soaked sponges, wrapped around VITOSS soaked with the bone marrow  aspirate and the local autograft that we had. We placed this out over the  transverse processes at L4-L5 on the right for repeat arthrodesis. We then  used a 35-mm rod and placed these into the multiaxial screw heads of the  pedicle screws and locked these into position with the locking caps and  antitorque device. We then irrigated with copious amounts of bacitracin  containing saline solution. This was actually done prior to placement of the  bone graft material and the final rod. We placed a medium Hemovac drain  through a separate stab incision, dried all bleeding points, and then closed  the fascia with interrupted #1 Vicryl, closed the subcutaneous and  subcuticular tissues with 2-0 and 3-0 Vicryl, and closed the skin with  Benzoin and Steri-Strips. The drapes were removed. A sterile dressing was applied. The patient  was awakened from general anesthesia and transported to  the recovery room in stable condition. At the end of procedure all sponge,  needle and instrument counts were correct.      Tia Alert, MD  Electronically Signed     DSJ/MEDQ  D:  02/15/2005  T:  02/15/2005  Job:  [id number]

## 2010-06-17 NOTE — Op Note (Signed)
NAMELENNAN, MALONE             ACCOUNT NO.:  [id number]   MEDICAL RECORD NO.:  [id number]          PATIENT TYPE:  INP   LOCATION:  3023                         FACILITY:  [facility]   PHYSICIAN:  Tia Alert, MD     DATE OF BIRTH:  03/11/1950   DATE OF PROCEDURE:  01/30/2004  DATE OF DISCHARGE:                                 OPERATIVE REPORT   PREOPERATIVE DIAGNOSIS:  Recurrent pseudomeningocele with cerebrospinal  fluid leak.   POSTOPERATIVE DIAGNOSIS:  Recurrent pseudomeningocele with cerebrospinal  fluid leak.   PROCEDURE:  1.  Lumbar re-exploration for repeat repair of cerebrospinal fluid leak      causing pseudomeningocele.  2.  Placement of lumbar drain.   SURGEON:  Tia Alert, M.D.   ANESTHESIA:  General endotracheal.   COMPLICATIONS:  None apparent.   INDICATION FOR THE PROCEDURE:  Ms. Weingart is a 60 year old white female  who underwent a pseudomeningocele repair about 12 hours ago.  She was lying  flat in bed and had significant clear drainage from her incision once again,  soaking her sheets and her dressing.  Because of the CSF leak through the  skin and the risk of meningitis, I felt this was an emergency and she was  taken emergently to the operating room for repeat repair.  She understood  the risks, benefits and expected outcome and wished to proceed.  We planned  on proceeding with placement of a lumbar drain at the time of the surgery.   DESCRIPTION OF THE PROCEDURE:  The patient was taken to the operating room  where after induction of adequate general endotracheal anesthesia, she was  rolled in a prone position on the Wilson frame and all pressure points were  padded, and the lumbar region was prepped with DuraPrep and then draped in  the usual sterile fashion.  Her old incision was opened and again a large  amount of spinal fluid was released.  I dissected down through the fascia  and through the muscle, and localized the dura, once again remove  the  Duragen, Gelfoam and Tisseel fibrin glue.  I did not find any significant  dural tear; she had a slow leak through the repair site around the sutures,  therefore I harvested a piece of fat and sewed this into position over the  sutures.  I then used a lumbar drain kit and using the Tuohy needle, passed  the Tuohy needle into the spinal fluid space at L3-4 and fed the lumbar  drain through this with good flow of clear CSF.  I then sewed this into  position and ran it through a separate stab incision.  Then I did a Valsalva  procedure and this found no evidence of leakage of CSF from the original  spot.  We then repeated this several minutes later with no evidence of CSF  leak.  I then lined the dura once again with Tisseel fibrin glue and  Gelfoam.  I then closed the muscle and the fascia with interrupted #1  Vicryl, closed the subcutaneous and  subcuticular tissue with 2-0 and 3-0 Vicryl, and  closed the skin with a  running 3-0 Ethilon suture.  A sterile dressing was applied.  The patient  was awakened from general anesthesia and transferred to the recovery room in  stable condition.  At the end of the procedure, all sponge, needle and  instrument counts were correct.      Pervis Hocking   DSJ/MEDQ  D:  01/30/2004  T:  01/30/2004  Job:  [id number]

## 2010-06-17 NOTE — Op Note (Signed)
NAMEMURLEAN, [name]             ACCOUNT NO.:  [id number]   MEDICAL RECORD NO.:  [id number]          PATIENT TYPE:  INP   LOCATION:  3023                         FACILITY:  [facility]   PHYSICIAN:  Tia Alert, MD     DATE OF BIRTH:  1950/05/08   DATE OF PROCEDURE:  01/29/2004  DATE OF DISCHARGE:                                 OPERATIVE REPORT   PREOPERATIVE DIAGNOSIS:  Pseudomeningocele status post TLIF L4-L5.   POSTOPERATIVE DIAGNOSIS:  Pseudomeningocele status post TLIF L4-L5.   PROCEDURE:  Lumbar re-exploration for repair of pseudomeningocele secondary  to cerebrospinal fluid leak.   SURGEON:  Tia Alert, MD   ANESTHESIA:  General endotracheal anesthesia.   COMPLICATIONS:  None apparent.   INDICATIONS FOR PROCEDURE:  [name] is a 60 year old white female who  underwent a TLIF at L4-L5 15 days ago.  She developed postural headaches,  fever, and drainage of a clear fluid from her incision.  She got an MRI  which showed a large pseudomeningocele.  I recommended lumbar re-exploration  for repair of the pseudomeningocele and we started her on vancomycin and  Rocephin for presumed meningitis.  We also planned to send CSF during the  surgery.  The patient understood the risks, benefits, and alternatives and  wished to proceed.   DESCRIPTION OF PROCEDURE:  The patient was taken to the operating room and  after induction of adequate general endotracheal anesthesia, she was rolled  in a prone position on the Wilson frame and all pressure points padded.  Her  lumbar region was prepped with DuraPrep and then draped in the usual sterile  fashion.  A dorsal midline incision was made and carried down to the  lumbosacral  fascia.  A large amount of clear fluid was evident and was  removed with suction.  I dissected down to the dura.  I found the old repair  site which was intact.  She did have a new hole with a large amount of  arachnoid coming through the hold that measured  about 3 by 3 mm.  This was  adjacent to a sharp piece of bone at the superior lamina of L5 right in the  midline.  I was able to remove this sharp piece of bone, expose the hole,  and then place three interrupted 4-0 Nurolon sutures into this to repair the  leak.  I then used an LP kit to remove some CSF from the midline and then  also repaired  this hole and sent this CSF for gram stain, culture, protein,  glucose, and cell count with differential.  We then lined the dura with  Duragen and Gelfoam.  I then used a layer of Tisseel fibrin glue and then  another layer of Gelfoam and then another layer of Tisseel fibrin glue.  We  did, prior to doing this, check a Valsalva maneuver to make sure there were  no further leaks of spinal fluid prior to layering the dura.  Once this was  done, we closed the muscle and the fascia with interrupted #1 Vicryl, we  closed the subcutaneous tissue  and subcuticular tissue with 2-0 and 3-0  Vicryl, and closed the skin  with Benzoin and Steri-Strips.  The drapes were removed and sterile dressing  was applied.  The patient was awakened from general anesthesia and  transported to the recovery room in stable condition.  At the end of the  procedure, all sponge, needle, and instrument counts were correct.      Pervis Hocking   DSJ/MEDQ  D:  01/29/2004  T:  01/29/2004  Job:  [id number]

## 2010-11-17 ENCOUNTER — Other Ambulatory Visit: Payer: Self-pay | Admitting: Family Medicine

## 2010-11-17 DIAGNOSIS — Z1231 Encounter for screening mammogram for malignant neoplasm of breast: Secondary | ICD-10-CM

## 2010-12-01 ENCOUNTER — Other Ambulatory Visit: Payer: Self-pay | Admitting: Neurological Surgery

## 2010-12-01 DIAGNOSIS — M542 Cervicalgia: Secondary | ICD-10-CM

## 2010-12-01 DIAGNOSIS — M549 Dorsalgia, unspecified: Secondary | ICD-10-CM

## 2010-12-01 DIAGNOSIS — M545 Low back pain: Secondary | ICD-10-CM

## 2010-12-05 ENCOUNTER — Ambulatory Visit
Admission: RE | Admit: 2010-12-05 | Discharge: 2010-12-05 | Disposition: A | Discharge status: Home / Self Care | Payer: BC Managed Care – PPO | Source: Ambulatory Visit | Attending: Family Medicine | Admitting: Family Medicine

## 2010-12-05 DIAGNOSIS — Z1231 Encounter for screening mammogram for malignant neoplasm of breast: Secondary | ICD-10-CM

## 2010-12-06 ENCOUNTER — Ambulatory Visit
Admission: RE | Admit: 2010-12-06 | Discharge: 2010-12-06 | Disposition: A | Discharge status: Home / Self Care | Payer: BC Managed Care – PPO | Source: Ambulatory Visit | Attending: Neurological Surgery | Admitting: Neurological Surgery

## 2010-12-06 DIAGNOSIS — M545 Low back pain, unspecified: Secondary | ICD-10-CM

## 2010-12-06 DIAGNOSIS — M549 Dorsalgia, unspecified: Secondary | ICD-10-CM

## 2010-12-06 DIAGNOSIS — M542 Cervicalgia: Secondary | ICD-10-CM

## 2011-01-26 ENCOUNTER — Other Ambulatory Visit: Payer: Self-pay | Admitting: Family Medicine

## 2011-01-26 ENCOUNTER — Ambulatory Visit
Admission: RE | Admit: 2011-01-26 | Discharge: 2011-01-26 | Disposition: A | Discharge status: Home / Self Care | Payer: BC Managed Care – PPO | Source: Ambulatory Visit | Attending: Family Medicine | Admitting: Family Medicine

## 2011-01-26 DIAGNOSIS — R109 Unspecified abdominal pain: Secondary | ICD-10-CM

## 2011-02-08 DIAGNOSIS — I10 Essential (primary) hypertension: Secondary | ICD-10-CM | POA: Diagnosis not present

## 2011-02-08 DIAGNOSIS — M545 Low back pain, unspecified: Secondary | ICD-10-CM | POA: Diagnosis not present

## 2011-03-07 DIAGNOSIS — E7800 Pure hypercholesterolemia, unspecified: Secondary | ICD-10-CM | POA: Diagnosis not present

## 2011-03-07 DIAGNOSIS — R5383 Other fatigue: Secondary | ICD-10-CM | POA: Diagnosis not present

## 2011-03-07 DIAGNOSIS — I10 Essential (primary) hypertension: Secondary | ICD-10-CM | POA: Diagnosis not present

## 2011-03-07 DIAGNOSIS — R5381 Other malaise: Secondary | ICD-10-CM | POA: Diagnosis not present

## 2011-03-07 DIAGNOSIS — N3000 Acute cystitis without hematuria: Secondary | ICD-10-CM | POA: Diagnosis not present

## 2011-05-08 DIAGNOSIS — F321 Major depressive disorder, single episode, moderate: Secondary | ICD-10-CM | POA: Diagnosis not present

## 2011-05-23 DIAGNOSIS — IMO0002 Reserved for concepts with insufficient information to code with codable children: Secondary | ICD-10-CM | POA: Diagnosis not present

## 2011-05-31 ENCOUNTER — Encounter (HOSPITAL_COMMUNITY): Payer: Self-pay | Admitting: Pharmacy Technician

## 2011-06-02 ENCOUNTER — Other Ambulatory Visit: Payer: Self-pay | Admitting: Neurological Surgery

## 2011-06-07 ENCOUNTER — Encounter (HOSPITAL_COMMUNITY)
Admission: RE | Admit: 2011-06-07 | Discharge: 2011-06-07 | Disposition: A | Discharge status: Home / Self Care | Payer: BC Managed Care – PPO | Source: Ambulatory Visit | Attending: Neurological Surgery | Admitting: Neurological Surgery

## 2011-06-07 ENCOUNTER — Encounter (HOSPITAL_COMMUNITY): Payer: Self-pay

## 2011-06-07 DIAGNOSIS — M519 Unspecified thoracic, thoracolumbar and lumbosacral intervertebral disc disorder: Secondary | ICD-10-CM | POA: Diagnosis not present

## 2011-06-07 HISTORY — DX: Other specified postprocedural states: Z98.890

## 2011-06-07 HISTORY — DX: Myoneural disorder, unspecified: G70.9

## 2011-06-07 HISTORY — DX: Nausea with vomiting, unspecified: R11.2

## 2011-06-07 HISTORY — DX: Pneumonia, unspecified organism: J18.9

## 2011-06-07 HISTORY — DX: Essential (primary) hypertension: I10

## 2011-06-07 LAB — DIFFERENTIAL
Basophils Absolute: 0 10*3/uL (ref 0.0–0.1)
Eosinophils Relative: 3 % (ref 0–5)
Lymphocytes Relative: 30 % (ref 12–46)
Lymphs Abs: 3.3 10*3/uL (ref 0.7–4.0)
Monocytes Absolute: 0.6 10*3/uL (ref 0.1–1.0)
Neutro Abs: 6.6 10*3/uL (ref 1.7–7.7)

## 2011-06-07 LAB — BASIC METABOLIC PANEL
CO2: 29 mEq/L (ref 19–32)
Chloride: 100 mEq/L (ref 96–112)
GFR calc Af Amer: >90 mL/min (ref >90)
Potassium: 3.6 mEq/L (ref 3.5–5.1)
Sodium: 139 mEq/L (ref 135–145)

## 2011-06-07 LAB — CBC
HCT: 40.4 % (ref 36.0–46.0)
Hemoglobin: 13.5 g/dL (ref 12.0–15.0)
MCV: 92 fL (ref 78.0–100.0)
RBC: 4.39 MIL/uL (ref 3.87–5.11)
RDW: 12.8 % (ref 11.5–15.5)
WBC: 10.8 10*3/uL — ABNORMAL HIGH (ref 4.0–10.5)

## 2011-06-07 LAB — SURGICAL PCR SCREEN
MRSA, PCR: NEGATIVE
Staphylococcus aureus: NEGATIVE

## 2011-06-07 NOTE — Pre-Procedure Instructions (Signed)
20 [name]  06/07/2011   Your procedure is scheduled on:  06/15/11 Thursday  Report to [facility] at 0530 AM.  Call this number if you have problems the morning of surgery: [phone]   Remember:   Do not eat food:After Midnight.  May have clear liquids: up to 4 Hours before arrival.(130) AM..November 26, 1950 NOT DRINK ANY LIQUIDS AFTER 0130 AM THE DAY OF SURGERY.   Clear liquids include soda, tea, black coffee, apple or grape juice, broth.  Take these medicines the morning of surgery with A SIP OF WATER: CYMBALTA  HYDROCODONE  CYTOMEL  ATIVAN IF NEEDED  AMLODIPINE  SCOP PATCH.    Do not wear jewelry, make-up or nail polish.  Do not wear lotions, powders, or perfumes. You may wear deodorant.  Do not shave 48 hours prior to surgery.  Do not bring valuables to the hospital.  Contacts, dentures or bridgework may not be worn into surgery.  Leave suitcase in the car. After surgery it may be brought to your room.  For patients admitted to the hospital, checkout time is 11:00 AM the day of discharge.   Patients discharged the day of surgery will not be allowed to drive home.  Name and phone number of your driver: Sandy Salaam)  CELL [phone]  Special Instructions: CHG Shower Use Special Wash: 1/2 bottle night before surgery and 1/2 bottle morning of surgery.   Please read over the following fact sheets that you were given: Pain Booklet, Coughing and Deep Breathing, MRSA Information and Surgical Site Infection Prevention

## 2011-06-08 NOTE — Consult Note (Signed)
Anesthesia Chart Review:  Patient is a 61 year old female scheduled for removal of non-segmental lumbar hardware on 06/15/11.  History includes non-smoker, post-operative N/V, HTN, remote history of PNA, neuromuscular disease (not specified).  She has had multiple surgeries including three back surgeries.  EKG from 06/07/11 showed NSR.  CXR from 06/07/11 showed: Hyperaerated lungs without acute infiltrate.  Questionable 6 mm diameter nodular density lower left chest. As this is new since previous exams, recommend CT imaging to exclude pulmonary nodule. I called this result to Erie Noe at Dr. Edward Qualia office and asked that she make sure Dr. Yetta Barre and/or patient's PCP was aware of these results so it can be followed up appropriately (either pre or post op).  Labs acceptable.  If no acute changes, then okay to proceed from an Anesthesia standpoint.  Shonna Chock, PA-C

## 2011-06-14 MED ORDER — CEFAZOLIN SODIUM-DEXTROSE 2-3 GM-% IV SOLR
2.0000 g | INTRAVENOUS | Status: AC
  Administered 2011-06-15: 2 g via INTRAVENOUS
  Filled 2011-06-14: qty 50

## 2011-06-15 ENCOUNTER — Encounter (HOSPITAL_COMMUNITY)
Admission: RE | Disposition: A | Discharge status: Home / Self Care | Payer: Self-pay | Source: Ambulatory Visit | Attending: Neurological Surgery

## 2011-06-15 ENCOUNTER — Encounter (HOSPITAL_COMMUNITY): Payer: Self-pay | Admitting: Vascular Surgery

## 2011-06-15 ENCOUNTER — Ambulatory Visit (HOSPITAL_COMMUNITY): Payer: BC Managed Care – PPO

## 2011-06-15 ENCOUNTER — Encounter (HOSPITAL_COMMUNITY): Payer: Self-pay | Admitting: Radiology

## 2011-06-15 ENCOUNTER — Ambulatory Visit (HOSPITAL_COMMUNITY)
Admission: RE | Admit: 2011-06-15 | Discharge: 2011-06-15 | Disposition: A | Discharge status: Home / Self Care | Payer: BC Managed Care – PPO | Source: Ambulatory Visit | Attending: Neurological Surgery | Admitting: Neurological Surgery

## 2011-06-15 ENCOUNTER — Inpatient Hospital Stay (HOSPITAL_COMMUNITY): Payer: BC Managed Care – PPO | Admitting: Vascular Surgery

## 2011-06-15 ENCOUNTER — Encounter (HOSPITAL_COMMUNITY): Payer: Self-pay | Admitting: Neurological Surgery

## 2011-06-15 DIAGNOSIS — T84498A Other mechanical complication of other internal orthopedic devices, implants and grafts, initial encounter: Secondary | ICD-10-CM | POA: Diagnosis not present

## 2011-06-15 DIAGNOSIS — I10 Essential (primary) hypertension: Secondary | ICD-10-CM | POA: Insufficient documentation

## 2011-06-15 DIAGNOSIS — Z981 Arthrodesis status: Secondary | ICD-10-CM

## 2011-06-15 DIAGNOSIS — R918 Other nonspecific abnormal finding of lung field: Secondary | ICD-10-CM | POA: Diagnosis not present

## 2011-06-15 DIAGNOSIS — J984 Other disorders of lung: Secondary | ICD-10-CM | POA: Diagnosis not present

## 2011-06-15 DIAGNOSIS — Z472 Encounter for removal of internal fixation device: Secondary | ICD-10-CM | POA: Insufficient documentation

## 2011-06-15 DIAGNOSIS — T8489XA Other specified complication of internal orthopedic prosthetic devices, implants and grafts, initial encounter: Secondary | ICD-10-CM | POA: Diagnosis not present

## 2011-06-15 DIAGNOSIS — G8929 Other chronic pain: Secondary | ICD-10-CM | POA: Insufficient documentation

## 2011-06-15 DIAGNOSIS — IMO0002 Reserved for concepts with insufficient information to code with codable children: Secondary | ICD-10-CM | POA: Diagnosis not present

## 2011-06-15 DIAGNOSIS — M549 Dorsalgia, unspecified: Secondary | ICD-10-CM | POA: Insufficient documentation

## 2011-06-15 SURGERY — POSTERIOR LUMBAR FUSION 1 WITH HARDWARE REMOVAL
Anesthesia: General | Site: Back

## 2011-06-15 MED ORDER — ACETAMINOPHEN 650 MG RE SUPP: 650.0000 mg | RECTAL | Status: DC | PRN

## 2011-06-15 MED ORDER — LIDOCAINE HCL (CARDIAC) 20 MG/ML IV SOLN
INTRAVENOUS | Status: DC | PRN
  Administered 2011-06-15: 50 mg via INTRAVENOUS

## 2011-06-15 MED ORDER — IOHEXOL 300 MG/ML  SOLN
75.0000 mL | Freq: Once | INTRAMUSCULAR | Status: AC | PRN
  Administered 2011-06-15: 75 mL via INTRAVENOUS

## 2011-06-15 MED ORDER — GLYCOPYRROLATE 0.2 MG/ML IJ SOLN
INTRAMUSCULAR | Status: DC | PRN
  Administered 2011-06-15: .8 mg via INTRAVENOUS

## 2011-06-15 MED ORDER — SODIUM CHLORIDE 0.9 % IV SOLN
200.0000 ug | INTRAVENOUS | Status: DC | PRN
  Administered 2011-06-15: .75 ug/kg/h via INTRAVENOUS

## 2011-06-15 MED ORDER — FENTANYL CITRATE 0.05 MG/ML IJ SOLN
INTRAMUSCULAR | Status: DC | PRN
  Administered 2011-06-15: 50 ug via INTRAVENOUS
  Administered 2011-06-15: 150 ug via INTRAVENOUS

## 2011-06-15 MED ORDER — HEMOSTATIC AGENTS (NO CHARGE) OPTIME
TOPICAL | Status: DC | PRN
  Administered 2011-06-15: 1 via TOPICAL

## 2011-06-15 MED ORDER — MIDAZOLAM HCL 5 MG/5ML IJ SOLN
INTRAMUSCULAR | Status: DC | PRN
  Administered 2011-06-15: 2 mg via INTRAVENOUS

## 2011-06-15 MED ORDER — SODIUM CHLORIDE 0.9 % IJ SOLN: 3.0000 mL | INTRAMUSCULAR | Status: DC | PRN

## 2011-06-15 MED ORDER — BUPIVACAINE HCL (PF) 0.25 % IJ SOLN
INTRAMUSCULAR | Status: DC | PRN
  Administered 2011-06-15: 8 mL

## 2011-06-15 MED ORDER — LACTATED RINGERS IV SOLN
INTRAVENOUS | Status: DC | PRN
  Administered 2011-06-15: 07:00:00 via INTRAVENOUS

## 2011-06-15 MED ORDER — OLMESARTAN-AMLODIPINE-HCTZ 40-5-25 MG PO TABS: 0.5000 | ORAL_TABLET | Freq: Every day | ORAL | Status: DC

## 2011-06-15 MED ORDER — HYDROMORPHONE HCL PF 1 MG/ML IJ SOLN: 0.2500 mg | INTRAMUSCULAR | Status: DC | PRN

## 2011-06-15 MED ORDER — SODIUM CHLORIDE 0.9 % IV SOLN: 250.0000 mL | INTRAVENOUS | Status: DC

## 2011-06-15 MED ORDER — AMLODIPINE BESYLATE 2.5 MG PO TABS
2.5000 mg | ORAL_TABLET | Freq: Every day | ORAL | Status: DC
  Filled 2011-06-15: qty 1

## 2011-06-15 MED ORDER — NEOSTIGMINE METHYLSULFATE 1 MG/ML IJ SOLN
INTRAMUSCULAR | Status: DC | PRN
  Administered 2011-06-15: 5 mg via INTRAVENOUS

## 2011-06-15 MED ORDER — SODIUM CHLORIDE 0.9 % IJ SOLN: 3.0000 mL | Freq: Two times a day (BID) | INTRAMUSCULAR | Status: DC

## 2011-06-15 MED ORDER — 0.9 % SODIUM CHLORIDE (POUR BTL) OPTIME
TOPICAL | Status: DC | PRN
  Administered 2011-06-15: 1000 mL

## 2011-06-15 MED ORDER — ONDANSETRON HCL 4 MG/2ML IJ SOLN: 4.0000 mg | Freq: Once | INTRAMUSCULAR | Status: DC | PRN

## 2011-06-15 MED ORDER — DULOXETINE HCL 60 MG PO CPEP
60.0000 mg | ORAL_CAPSULE | Freq: Two times a day (BID) | ORAL | Status: DC
  Filled 2011-06-15: qty 1

## 2011-06-15 MED ORDER — HYDROMORPHONE HCL PF 1 MG/ML IJ SOLN: 0.5000 mg | INTRAMUSCULAR | Status: DC | PRN

## 2011-06-15 MED ORDER — THROMBIN 5000 UNITS EX SOLR
CUTANEOUS | Status: DC | PRN
  Administered 2011-06-15 (×2): 5000 [IU] via TOPICAL

## 2011-06-15 MED ORDER — BACITRACIN 50000 UNITS IM SOLR
INTRAMUSCULAR | Status: AC
  Filled 2011-06-15: qty 1

## 2011-06-15 MED ORDER — HYDROXYZINE HCL 25 MG PO TABS: 25.0000 mg | ORAL_TABLET | Freq: Four times a day (QID) | ORAL | Status: DC | PRN

## 2011-06-15 MED ORDER — HYDROCODONE-ACETAMINOPHEN 10-325 MG PO TABS
1.0000 | ORAL_TABLET | ORAL | Status: DC | PRN
  Administered 2011-06-15 (×2): 2 via ORAL
  Filled 2011-06-15 (×2): qty 2

## 2011-06-15 MED ORDER — HYDROMORPHONE HCL PF 1 MG/ML IJ SOLN
INTRAMUSCULAR | Status: AC
  Filled 2011-06-15: qty 1

## 2011-06-15 MED ORDER — IRBESARTAN 150 MG PO TABS
150.0000 mg | ORAL_TABLET | Freq: Every day | ORAL | Status: DC
  Filled 2011-06-15: qty 1

## 2011-06-15 MED ORDER — LIOTHYRONINE SODIUM 25 MCG PO TABS: 37.5000 ug | ORAL_TABLET | Freq: Every day | ORAL | Status: DC

## 2011-06-15 MED ORDER — TEMAZEPAM 15 MG PO CAPS: 15.0000 mg | ORAL_CAPSULE | Freq: Every evening | ORAL | Status: DC | PRN

## 2011-06-15 MED ORDER — CEFAZOLIN SODIUM 1-5 GM-% IV SOLN
1.0000 g | Freq: Three times a day (TID) | INTRAVENOUS | Status: DC
  Administered 2011-06-15: 1 g via INTRAVENOUS
  Filled 2011-06-15 (×2): qty 50

## 2011-06-15 MED ORDER — POTASSIUM CHLORIDE IN NACL 20-0.9 MEQ/L-% IV SOLN
INTRAVENOUS | Status: DC
  Filled 2011-06-15 (×2): qty 1000

## 2011-06-15 MED ORDER — PHENOL 1.4 % MT LIQD: 1.0000 | OROMUCOSAL | Status: DC | PRN

## 2011-06-15 MED ORDER — DEXAMETHASONE SODIUM PHOSPHATE 10 MG/ML IJ SOLN
INTRAMUSCULAR | Status: DC | PRN
  Administered 2011-06-15: 8 mg via INTRAVENOUS

## 2011-06-15 MED ORDER — LORAZEPAM 0.5 MG PO TABS: 1.0000 mg | ORAL_TABLET | Freq: Three times a day (TID) | ORAL | Status: DC | PRN

## 2011-06-15 MED ORDER — FAMOTIDINE 20 MG PO TABS: 20.0000 mg | ORAL_TABLET | Freq: Every day | ORAL | Status: DC

## 2011-06-15 MED ORDER — SODIUM CHLORIDE 0.9 % IV SOLN
INTRAVENOUS | Status: AC
  Filled 2011-06-15: qty 500

## 2011-06-15 MED ORDER — HYDROCHLOROTHIAZIDE 12.5 MG PO CAPS
12.5000 mg | ORAL_CAPSULE | Freq: Every day | ORAL | Status: DC
  Filled 2011-06-15: qty 1

## 2011-06-15 MED ORDER — HYDROMORPHONE HCL PF 1 MG/ML IJ SOLN
0.2500 mg | INTRAMUSCULAR | Status: DC | PRN
  Administered 2011-06-15 (×2): 0.25 mg via INTRAVENOUS

## 2011-06-15 MED ORDER — ACETAMINOPHEN 325 MG PO TABS: 650.0000 mg | ORAL_TABLET | ORAL | Status: DC | PRN

## 2011-06-15 MED ORDER — MENTHOL 3 MG MT LOZG: 1.0000 | LOZENGE | OROMUCOSAL | Status: DC | PRN

## 2011-06-15 MED ORDER — ONDANSETRON HCL 4 MG/2ML IJ SOLN: 4.0000 mg | INTRAMUSCULAR | Status: DC | PRN

## 2011-06-15 MED ORDER — ROCURONIUM BROMIDE 100 MG/10ML IV SOLN
INTRAVENOUS | Status: DC | PRN
  Administered 2011-06-15: 40 mg via INTRAVENOUS

## 2011-06-15 MED ORDER — SCOPOLAMINE 1 MG/3DAYS TD PT72
MEDICATED_PATCH | TRANSDERMAL | Status: DC | PRN
  Administered 2011-06-15: 1 via TRANSDERMAL

## 2011-06-15 MED ORDER — ONDANSETRON HCL 4 MG/2ML IJ SOLN
INTRAMUSCULAR | Status: DC | PRN
  Administered 2011-06-15: 4 mg via INTRAVENOUS

## 2011-06-15 MED ORDER — PROPOFOL 10 MG/ML IV EMUL
INTRAVENOUS | Status: DC | PRN
  Administered 2011-06-15: 180 mg via INTRAVENOUS

## 2011-06-15 MED ORDER — SODIUM CHLORIDE 0.9 % IV SOLN
0.4000 ug/kg/h | INTRAVENOUS | Status: DC
  Filled 2011-06-15: qty 2

## 2011-06-15 MED ORDER — RALOXIFENE HCL 60 MG PO TABS
60.0000 mg | ORAL_TABLET | Freq: Every day | ORAL | Status: DC
  Filled 2011-06-15: qty 1

## 2011-06-15 SURGICAL SUPPLY — 49 items
APL SKNCLS STERI-STRIP NONHPOA (GAUZE/BANDAGES/DRESSINGS) ×1
BAG DECANTER FOR FLEXI CONT (MISCELLANEOUS) ×2 IMPLANT
BENZOIN TINCTURE PRP APPL 2/3 (GAUZE/BANDAGES/DRESSINGS) ×2 IMPLANT
BLADE SURG ROTATE 9660 (MISCELLANEOUS) IMPLANT
BUR MATCHSTICK NEURO 3.0 LAGG (BURR) ×2 IMPLANT
CANISTER SUCTION 2500CC (MISCELLANEOUS) ×2 IMPLANT
CLOTH BEACON ORANGE TIMEOUT ST (SAFETY) ×2 IMPLANT
CONT SPEC 4OZ CLIKSEAL STRL BL (MISCELLANEOUS) ×4 IMPLANT
COVER BACK TABLE 24X17X13 BIG (DRAPES) IMPLANT
COVER TABLE BACK 60X90 (DRAPES) ×2 IMPLANT
DRAPE C-ARM 42X72 X-RAY (DRAPES) ×4 IMPLANT
DRAPE LAPAROTOMY 100X72X124 (DRAPES) ×2 IMPLANT
DRAPE POUCH INSTRU U-SHP 10X18 (DRAPES) ×2 IMPLANT
DRAPE SURG 17X23 STRL (DRAPES) ×2 IMPLANT
DRESSING TELFA 8X3 (GAUZE/BANDAGES/DRESSINGS) ×2 IMPLANT
DRSG OPSITE 4X5.5 SM (GAUZE/BANDAGES/DRESSINGS) ×3 IMPLANT
DURAPREP 26ML APPLICATOR (WOUND CARE) ×2 IMPLANT
ELECT REM PT RETURN 9FT ADLT (ELECTROSURGICAL) ×2
ELECTRODE REM PT RTRN 9FT ADLT (ELECTROSURGICAL) ×1 IMPLANT
EVACUATOR 1/8 PVC DRAIN (DRAIN) ×2 IMPLANT
GAUZE SPONGE 4X4 16PLY XRAY LF (GAUZE/BANDAGES/DRESSINGS) IMPLANT
GLOVE BIO SURGEON STRL SZ8 (GLOVE) ×4 IMPLANT
GLOVE ECLIPSE 7.5 STRL STRAW (GLOVE) ×3 IMPLANT
GLOVE INDICATOR 8.0 STRL GRN (GLOVE) ×1 IMPLANT
GOWN BRE IMP SLV AUR LG STRL (GOWN DISPOSABLE) IMPLANT
GOWN BRE IMP SLV AUR XL STRL (GOWN DISPOSABLE) ×4 IMPLANT
GOWN STRL REIN 2XL LVL4 (GOWN DISPOSABLE) IMPLANT
HEMOSTAT POWDER KIT SURGIFOAM (HEMOSTASIS) IMPLANT
KIT BASIN OR (CUSTOM PROCEDURE TRAY) ×2 IMPLANT
KIT ROOM TURNOVER OR (KITS) ×2 IMPLANT
NDL HYPO 25X1 1.5 SAFETY (NEEDLE) ×1 IMPLANT
NEEDLE HYPO 25X1 1.5 SAFETY (NEEDLE) ×2 IMPLANT
NS IRRIG 1000ML POUR BTL (IV SOLUTION) ×2 IMPLANT
PACK LAMINECTOMY NEURO (CUSTOM PROCEDURE TRAY) ×2 IMPLANT
PAD ARMBOARD 7.5X6 YLW CONV (MISCELLANEOUS) ×6 IMPLANT
SPONGE GAUZE 4X4 12PLY (GAUZE/BANDAGES/DRESSINGS) ×1 IMPLANT
SPONGE LAP 4X18 X RAY DECT (DISPOSABLE) IMPLANT
SPONGE SURGIFOAM ABS GEL 100 (HEMOSTASIS) ×2 IMPLANT
STRIP CLOSURE SKIN 1/2X4 (GAUZE/BANDAGES/DRESSINGS) ×3 IMPLANT
SUT VIC AB 0 CT1 18XCR BRD8 (SUTURE) ×1 IMPLANT
SUT VIC AB 0 CT1 8-18 (SUTURE) ×2
SUT VIC AB 2-0 CP2 18 (SUTURE) ×2 IMPLANT
SUT VIC AB 3-0 SH 8-18 (SUTURE) ×4 IMPLANT
SYR 20ML ECCENTRIC (SYRINGE) ×2 IMPLANT
TOWEL OR 17X24 6PK STRL BLUE (TOWEL DISPOSABLE) ×2 IMPLANT
TOWEL OR 17X26 10 PK STRL BLUE (TOWEL DISPOSABLE) ×2 IMPLANT
TRAP SPECIMEN MUCOUS 40CC (MISCELLANEOUS) IMPLANT
TRAY FOLEY CATH 14FRSI W/METER (CATHETERS) ×2 IMPLANT
WATER STERILE IRR 1000ML POUR (IV SOLUTION) ×2 IMPLANT

## 2011-06-15 NOTE — Op Note (Signed)
06/15/2011  8:41 AM  PATIENT:  [name]  61 y.o. female  PRE-OPERATIVE DIAGNOSIS:  Painful lumbar hardware  POST-OPERATIVE DIAGNOSIS:  Same  PROCEDURE:  Removal of nonsegmental lumbar hardware  SURGEON:  Marikay Alar, MD  ASSISTANTS: None  ANESTHESIA:   General  EBL:  25 ml or less  Total I/O In: 800 [I.V.:800] Out: -   BLOOD ADMINISTERED:none  DRAINS: None   SPECIMEN:  No Specimen  INDICATION FOR PROCEDURE: Patient is a long time patient of mine who has had chronic back pain after a lumbar fusion at L4-5. She asked to have the hardware removed in hopes of improvement of her back pain. Patient understood the risks, benefits, and alternatives and potential outcomes and wished to proceed.  PROCEDURE DETAILS: The patient was taken to the operating room and after induction of adequate generalized endotracheal anesthesia she was rolled in the prone position on chest rolls and all pressure points were padded. The lumbar region was prepped with DuraPrep and then draped in usual sterile fashion. 8 cc of local anesthesia was injected and a dorsal midline incision was made through her old incision. I dissected down inspected and found the spinous processes and then carried my dissection out laterally until the tulip heads were identified. I removed the soft tissue and then removed the locking caps and then the rods. Then pulled on each screw head in every screw head moved in unison suggesting fusion. Then removed each of the pedicle screws, again with saline solution containing bacitracin, and then closed the fascia with 0 Vicryl. Closed the subcutaneous and subcuticular tissue with 202 0 Vicryl. The skin was closed with benzoin and Steri-Strips. A sterile dressing was applied. The patient was awakened from general anesthesia and transferred to the recovery was stable condition. At the end of the procedure all sponge needle and instrument counts were correct.  PLAN OF CARE: Admit for  overnight observation  PATIENT DISPOSITION:  PACU - hemodynamically stable.   Delay start of Pharmacological VTE agent (>24hrs) due to surgical blood loss or risk of bleeding:  yes

## 2011-06-15 NOTE — Anesthesia Postprocedure Evaluation (Signed)
  Anesthesia Post-op Note  Patient: [name]  Procedure(s) Performed: Procedure(s) (LRB): POSTERIOR LUMBAR FUSION 1 WITH HARDWARE REMOVAL (N/A)  Patient Location: PACU  Anesthesia Type: General  Level of Consciousness: awake, alert  and oriented  Airway and Oxygen Therapy: Patient Spontanous Breathing and Patient connected to nasal cannula oxygen  Post-op Pain: mild  Post-op Assessment: Post-op Vital signs reviewed and Patient's Cardiovascular Status Stable  Post-op Vital Signs: stable  Complications: No apparent anesthesia complications

## 2011-06-15 NOTE — Discharge Instructions (Signed)
Wound Care Keep incision covered and dry for one week.  If you shower prior to then, cover incision with plastic wrap.  You may remove outer bandage after one week and shower.  Do not put any creams, lotions, or ointments on incision. Leave steri-strips on back.  They will fall off by themselves. Activity Walk each and every day, increasing distance each day. No lifting greater than 5 lbs.  Avoid bending, arching, or twisting. No driving for 2 weeks; may ride as a passenger locally.  Diet Resume your normal diet.  Return to Work Will be discussed at you follow up appointment. Call Your Doctor If Any of These Occur Redness, drainage, or swelling at the wound.  Temperature greater than 101 degrees. Severe pain not relieved by pain medication. Incision starts to come apart. Follow Up Appt Call today for appointment in 1-2 weeks [phone]) or for problems.  If you have any hardware placed in your spine, you will need an x-ray before your appointment. 

## 2011-06-15 NOTE — Anesthesia Preprocedure Evaluation (Addendum)
Anesthesia Evaluation  Patient identified by MRN, date of birth, ID band Patient awake    Reviewed: Allergy & Precautions, H&P , NPO status , Patient's Chart, lab work & pertinent test results, reviewed documented beta blocker date and time   History of Anesthesia Complications (+) PONV  Airway Mallampati: III TM Distance: >3 FB Neck ROM: Full  Mouth opening: Limited Mouth Opening  Dental  (+) Lower Dentures and Upper Dentures   Pulmonary  breath sounds clear to auscultation        Cardiovascular hypertension, Pt. on medications Rhythm:Regular Rate:Normal     Neuro/Psych    GI/Hepatic   Endo/Other    Renal/GU      Musculoskeletal   Abdominal   Peds  Hematology   Anesthesia Other Findings   Reproductive/Obstetrics                          Anesthesia Physical Anesthesia Plan  ASA: II  Anesthesia Plan: General   Post-op Pain Management:    Induction: Intravenous  Airway Management Planned: Oral ETT  Additional Equipment:   Intra-op Plan:   Post-operative Plan: Extubation in OR  Informed Consent: I have reviewed the patients History and Physical, chart, labs and discussed the procedure including the risks, benefits and alternatives for the proposed anesthesia with the patient or authorized representative who has indicated his/her understanding and acceptance.     Plan Discussed with:   Anesthesia Plan Comments: (Chronic LBP Htn H/O Post-op N/V  Plan GA)        Anesthesia Quick Evaluation

## 2011-06-15 NOTE — Transfer of Care (Signed)
Immediate Anesthesia Transfer of Care Note  Patient: [name]  Procedure(s) Performed: Procedure(s) (LRB): POSTERIOR LUMBAR FUSION 1 WITH HARDWARE REMOVAL (N/A)  Patient Location: PACU  Anesthesia Type: General  Level of Consciousness: awake, alert , oriented and patient cooperative  Airway & Oxygen Therapy: Patient Spontanous Breathing and Patient connected to face mask oxygen  Post-op Assessment: Report given to PACU RN, Post -op Vital signs reviewed and stable and Patient moving all extremities  Post vital signs: Reviewed and stable  Complications: No apparent anesthesia complications

## 2011-06-15 NOTE — Preoperative (Signed)
Beta Blockers   Reason not to administer Beta Blockers:Not Applicable 

## 2011-06-15 NOTE — Discharge Summary (Signed)
Physician Discharge Summary  Patient ID: [name] MRN: [id number] DOB/AGE: Jun 07, 1950 61 y.o.  Admit date: 06/15/2011 Discharge date: 06/15/2011  Admission Diagnoses: painful hardware    Discharge Diagnoses: same   Discharged Condition: good  Hospital Course: The patient was admitted on 06/15/2011 and taken to the operating room where the patient underwent lumbar hardware removal. The patient tolerated the procedure well and was taken to the recovery room and then to the floor in stable condition. The hospital course was routine. There were no complications. The wound remained clean dry and intact. Pt had appropriate back soreness. No complaints of leg pain or new N/T/W. The patient remained afebrile with stable vital signs, and tolerated a regular diet. The patient continued to increase activities, and pain was well controlled with oral pain medications. Chest CT ordered to evaluate shadow on pre-op xray.  Consults: None  Significant Diagnostic Studies:  Results for orders placed during the hospital encounter of 06/07/11  BASIC METABOLIC PANEL      Component Value Range   Sodium 139  135 - 145 (mEq/L)   Potassium 3.6  3.5 - 5.1 (mEq/L)   Chloride 100  96 - 112 (mEq/L)   CO2 29  19 - 32 (mEq/L)   Glucose, Bld 100 (*) 70 - 99 (mg/dL)   BUN 7  6 - 23 (mg/dL)   Creatinine, Ser 4.09  0.50 - 1.10 (mg/dL)   Calcium 9.2  8.4 - 81.1 (mg/dL)   GFR calc non Af Amer >90  >90 (mL/min)   GFR calc Af Amer >90  >90 (mL/min)  CBC      Component Value Range   WBC 10.8 (*) 4.0 - 10.5 (K/uL)   RBC 4.39  3.87 - 5.11 (MIL/uL)   Hemoglobin 13.5  12.0 - 15.0 (g/dL)   HCT 91.4  78.2 - 95.6 (%)   MCV 92.0  78.0 - 100.0 (fL)   MCH 30.8  26.0 - 34.0 (pg)   MCHC 33.4  30.0 - 36.0 (g/dL)   RDW 21.3  08.6 - 57.8 (%)   Platelets 265  150 - 400 (K/uL)  DIFFERENTIAL      Component Value Range   Neutrophils Relative 61  43 - 77 (%)   Neutro Abs 6.6  1.7 - 7.7 (K/uL)   Lymphocytes Relative 30   12 - 46 (%)   Lymphs Abs 3.3  0.7 - 4.0 (K/uL)   Monocytes Relative 5  3 - 12 (%)   Monocytes Absolute 0.6  0.1 - 1.0 (K/uL)   Eosinophils Relative 3  0 - 5 (%)   Eosinophils Absolute 0.3  0.0 - 0.7 (K/uL)   Basophils Relative 0  0 - 1 (%)   Basophils Absolute 0.0  0.0 - 0.1 (K/uL)  PROTIME-INR      Component Value Range   Prothrombin Time 13.0  11.6 - 15.2 (seconds)   INR 0.96  0.00 - 1.49   SURGICAL PCR SCREEN      Component Value Range   MRSA, PCR NEGATIVE  NEGATIVE    Staphylococcus aureus NEGATIVE  NEGATIVE     Chest 2 View  06/07/2011  *RADIOLOGY REPORT*  Clinical Data: Preoperative assessment for lumbar fusion  CHEST - 2 VIEW  Comparison: 12/14/2008  Findings: Normal heart size, mediastinal contours, and pulmonary vascularity. Lungs appear hyperaerated but clear. No pleural effusion or pneumothorax. Questionable nodular density versus summation artifact at lower left chest, 6 mm diameter, new since prior exams. Prior cervical spine fusion.  IMPRESSION: Hyperaerated lungs without acute infiltrate. Questionable 6 mm diameter nodular density lower left chest. As this is new since previous exams, recommend CT imaging to exclude pulmonary nodule.  Original Report Authenticated By: Lollie Marrow, M.D.    Antibiotics:  Anti-infectives     Start     Dose/Rate Route Frequency Ordered Stop   06/15/11 1600   ceFAZolin (ANCEF) IVPB 1 g/50 mL premix        1 g 100 mL/hr over 30 Minutes Intravenous Every 8 hours 06/15/11 1036 06/16/11 0759   06/15/11 0725   bacitracin 40981 UNITS injection     Comments: RATCLIFF, ESTHER: cabinet override         06/15/11 0725 06/15/11 1929   06/14/11 1417   ceFAZolin (ANCEF) IVPB 2 g/50 mL premix        2 g 100 mL/hr over 30 Minutes Intravenous 60 min pre-op 06/14/11 1417 06/15/11 0747          Discharge Exam: Blood pressure 93/60, pulse 75, temperature 97.3 F (36.3 C), temperature source Oral, resp. rate 16, weight 92.08 kg (203 lb), SpO2  97.00%. Neurologic: Grossly normal Dressing dry  Discharge Medications:   Medication List  As of 06/15/2011  3:56 PM   TAKE these medications         DULoxetine 60 MG capsule   Commonly known as: CYMBALTA   Take 60 mg by mouth 2 (two) times daily.      estazolam 1 MG tablet   Commonly known as: PROSOM   Take 1 mg by mouth at bedtime.      EVISTA 60 MG tablet   Generic drug: raloxifene   Take 60 mg by mouth daily.      HYDROcodone-acetaminophen 10-650 MG per tablet   Commonly known as: LORCET   Take 1-2 tablets by mouth every 6 (six) hours as needed. pain      hydrOXYzine 25 MG tablet   Commonly known as: ATARAX/VISTARIL   Take 25 mg by mouth 3 (three) times daily as needed. For nausea      liothyronine 25 MCG tablet   Commonly known as: CYTOMEL   Take 37.5 mcg by mouth daily.      LORazepam 1 MG tablet   Commonly known as: ATIVAN   Take 1 mg by mouth every 8 (eight) hours as needed. For anxiety      pravastatin 40 MG tablet   Commonly known as: PRAVACHOL   Take 40 mg by mouth daily.      promethazine 25 MG tablet   Commonly known as: PHENERGAN   Take 25-50 mg by mouth every 6 (six) hours as needed. For nausea      ranitidine 300 MG tablet   Commonly known as: ZANTAC   Take 300 mg by mouth 2 (two) times daily.      scopolamine 1.5 MG   Commonly known as: TRANSDERM-SCOP   Place 1 patch onto the skin every 3 (three) days. Used prior to surgery      TRIBENZOR 40-5-25 MG Tabs   Generic drug: Olmesartan-Amlodipine-HCTZ   Take 0.5 tablets by mouth daily.            Disposition: home   Final Dx: lumbar hardware removal  Discharge Orders    Future Orders Please Complete By Expires   Diet - low sodium heart healthy      Increase activity slowly      Call MD for:  temperature >100.4  Call MD for:  severe uncontrolled pain      Call MD for:  persistant nausea and vomiting      Call MD for:  redness, tenderness, or signs of infection (pain, swelling,  redness, odor or green/yellow discharge around incision site)      Call MD for:  difficulty breathing, headache or visual disturbances      Remove dressing in 48 hours         Follow-up Information    Follow up with Nadea Kirkland S, MD. Schedule an appointment as soon as possible for a visit in 2 weeks.   Contact information:   [address] [place] [address] [phone]           Signed: Tia Alert 06/15/2011, 3:56 PM

## 2011-06-15 NOTE — H&P (Signed)
Subjective: Patient is a 61 y.o. female admitted for hardware removal and exploration of fusion. Onset of symptoms was yrs ago, progressively worse since that time.  The pain is rated severe and is located at the low back and radiates to LLE. The pain is described as throbbing and sharp and occurs all the time. The symptoms have been progressive. Symptoms are exacerbated by activity. MRI or CT showed no adjacent level stenosis.   Past Medical History  Diagnosis Date  . PONV (postoperative nausea and vomiting)   . Hypertension   . Pneumonia     YEARS AGO  . Neuromuscular disorder     Past Surgical History  Procedure Date  . Back surgery 2005    LOW BACK X 3  . Tonsillectomy   . 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 Appendectomy 60'S  . Abdominal hysterectomy 1976  . Tubal ligation 73  . Cholecystectomy 2001    Prior to Admission medications   Medication Sig Start Date End Date Taking? Authorizing Provider  DULoxetine (CYMBALTA) 60 MG capsule Take 60 mg by mouth 2 (two) times daily.   Yes Historical Provider, MD  estazolam (PROSOM) 1 MG tablet Take 1 mg by mouth at bedtime.   Yes Historical Provider, MD  HYDROcodone-acetaminophen (LORCET) 10-650 MG per tablet Take 1-2 tablets by mouth every 6 (six) hours as needed. pain   Yes Historical Provider, MD  hydrOXYzine (ATARAX/VISTARIL) 25 MG tablet Take 25 mg by mouth 3 (three) times daily as needed. For nausea   Yes Historical Provider, MD  liothyronine (CYTOMEL) 25 MCG tablet Take 37.5 mcg by mouth daily.   Yes Historical Provider, MD  LORazepam (ATIVAN) 1 MG tablet Take 1 mg by mouth every 8 (eight) hours as needed. For anxiety   Yes Historical Provider, MD  Olmesartan-Amlodipine-HCTZ (TRIBENZOR) 40-5-25 MG TABS Take 0.5 tablets by mouth daily.   Yes Historical Provider, MD  pravastatin (PRAVACHOL) 40 MG tablet Take 40 mg by mouth daily.   Yes Historical Provider, MD  promethazine (PHENERGAN) 25 MG tablet Take 25-50 mg by  mouth every 6 (six) hours as needed. For nausea   Yes Historical Provider, MD  raloxifene (EVISTA) 60 MG tablet Take 60 mg by mouth daily.   Yes Historical Provider, MD  ranitidine (ZANTAC) 300 MG tablet Take 300 mg by mouth 2 (two) times daily.   Yes Historical Provider, MD  scopolamine (TRANSDERM-SCOP) 1.5 MG Place 1 patch onto the skin every 3 (three) days. Used prior to surgery    Historical Provider, MD   Allergies  Allergen Reactions  . Codeine Nausea Only    And fast heart rate  . Hydrocodone Other (See Comments)    Hydrocodones cause headaches except Lorcet 10/650  . Nsaids Swelling    Face swells  . Stadol (Butorphanol Tartrate) Other (See Comments)    Fast heart rate, felt like going to die, trouble breathing.  . Sulfa Antibiotics Nausea Only  . Vimovo (Naproxen-Esomeprazole) Nausea Only    History  Substance Use Topics  . Smoking status: Not on file  . Smokeless tobacco: Not on file  . Alcohol Use: No    Family History  Problem Relation Age of Onset  . Anesthesia problems Mother      Review of Systems  Positive ROS: neg  All other systems have been reviewed and were otherwise negative with the exception of those mentioned in the HPI and as above.  Objective: Vital signs in last  24 hours:    General Appearance: Alert, cooperative, no distress, appears stated age Head: Normocephalic, without obvious abnormality, atraumatic Eyes: PERRL, conjunctiva/corneas clear, EOM's intact, fundi benign, both eyes      Ears: Normal TM's and external ear canals, both ears Throat: Lips, mucosa, and tongue normal; teeth and gums normal Neck: Supple, symmetrical, trachea midline, no adenopathy; thyroid: No enlargement/tenderness/nodules; no carotid bruit or JVD Back: Symmetric, no curvature, ROM normal, no CVA tenderness Lungs: Clear to auscultation bilaterally, respirations unlabored Heart: Regular rate and rhythm, S1 and S2 normal, no murmur, rub or gallop Abdomen: Soft,  non-tender, bowel sounds active all four quadrants, no masses, no organomegaly Extremities: Extremities normal, atraumatic, no cyanosis or edema Pulses: 2+ and symmetric all extremities Skin: Skin color, texture, turgor normal, no rashes or lesions  NEUROLOGIC:   Mental status: Alert and oriented x4,  no aphasia, good attention span, fund of knowledge, and memory Motor Exam - grossly normal Sensory Exam - grossly normal Reflexes: 1+ Coordination - grossly normal Gait - grossly normal Balance - grossly normal Cranial Nerves: I: smell Not tested  II: visual acuity  OS: nl    OD: nl  II: visual fields Full to confrontation  II: pupils Equal, round, reactive to light  III,VII: ptosis None  III,IV,VI: extraocular muscles  Full ROM  V: mastication Normal  V: facial light touch sensation  Normal  V,VII: corneal reflex  Present  VII: facial muscle function - upper  Normal  VII: facial muscle function - lower Normal  VIII: hearing Not tested  IX: soft palate elevation  Normal  IX,X: gag reflex Present  XI: trapezius strength  5/5  XI: sternocleidomastoid strength 5/5  XI: neck flexion strength  5/5  XII: tongue strength  Normal    Data Review Lab Results  Component Value Date   WBC 10.8* 06/07/2011   HGB 13.5 06/07/2011   HCT 40.4 06/07/2011   MCV 92.0 06/07/2011   PLT 265 06/07/2011   Lab Results  Component Value Date   NA 139 06/07/2011   K 3.6 06/07/2011   CL 100 06/07/2011   CO2 29 06/07/2011   BUN 7 06/07/2011   CREATININE 0.71 06/07/2011   GLUCOSE 100* 06/07/2011   Lab Results  Component Value Date   INR 0.96 06/07/2011    Assessment/Plan: Patient admitted for hardware removal . Patient has failed conservative therapy.  I explained the condition and procedure to the patient and answered any questions.  Patient wishes to proceed with procedure as planned. Understands risks/ benefits and typical outcomes of procedure.   Jame Seelig S 06/15/2011 6:22 AM

## 2011-06-15 NOTE — Anesthesia Procedure Notes (Signed)
Procedure Name: Intubation Date/Time: 06/15/2011 7:47 AM Performed by: Jerilee Hoh Pre-anesthesia Checklist: Patient identified, Emergency Drugs available, Suction available, Patient being monitored and Timeout performed Patient Re-evaluated:Patient Re-evaluated prior to inductionOxygen Delivery Method: Circle system utilized Preoxygenation: Pre-oxygenation with 100% oxygen Intubation Type: IV induction Ventilation: Mask ventilation without difficulty and Oral airway inserted - appropriate to patient size Laryngoscope Size: Hyacinth Meeker and 2 Grade View: Grade I Tube type: Oral Tube size: 7.0 mm Number of attempts: 1 Airway Equipment and Method: Stylet and Oral airway Placement Confirmation: ETT inserted through vocal cords under direct vision,  positive ETCO2 and breath sounds checked- equal and bilateral Secured at: 21 cm Tube secured with: Tape Dental Injury: Teeth and Oropharynx as per pre-operative assessment

## 2011-06-21 ENCOUNTER — Encounter (HOSPITAL_COMMUNITY): Payer: Self-pay

## 2011-07-07 ENCOUNTER — Encounter (HOSPITAL_COMMUNITY): Payer: Self-pay | Admitting: Emergency Medicine

## 2011-07-07 ENCOUNTER — Emergency Department (HOSPITAL_COMMUNITY)
Admission: EM | Admit: 2011-07-07 | Discharge: 2011-07-07 | Disposition: A | Discharge status: Home / Self Care | Payer: BC Managed Care – PPO | Attending: Emergency Medicine | Admitting: Emergency Medicine

## 2011-07-07 DIAGNOSIS — R197 Diarrhea, unspecified: Secondary | ICD-10-CM

## 2011-07-07 DIAGNOSIS — K5289 Other specified noninfective gastroenteritis and colitis: Secondary | ICD-10-CM | POA: Diagnosis not present

## 2011-07-07 DIAGNOSIS — I10 Essential (primary) hypertension: Secondary | ICD-10-CM | POA: Insufficient documentation

## 2011-07-07 DIAGNOSIS — Z79899 Other long term (current) drug therapy: Secondary | ICD-10-CM | POA: Insufficient documentation

## 2011-07-07 DIAGNOSIS — E876 Hypokalemia: Secondary | ICD-10-CM | POA: Diagnosis not present

## 2011-07-07 DIAGNOSIS — F17200 Nicotine dependence, unspecified, uncomplicated: Secondary | ICD-10-CM | POA: Insufficient documentation

## 2011-07-07 DIAGNOSIS — E860 Dehydration: Secondary | ICD-10-CM | POA: Diagnosis not present

## 2011-07-07 DIAGNOSIS — R112 Nausea with vomiting, unspecified: Secondary | ICD-10-CM | POA: Diagnosis not present

## 2011-07-07 LAB — CBC
HCT: 39.5 % (ref 36.0–46.0)
Hemoglobin: 13.1 g/dL (ref 12.0–15.0)
MCH: 30 pg (ref 26.0–34.0)
MCV: 90.6 fL (ref 78.0–100.0)
RBC: 4.36 MIL/uL (ref 3.87–5.11)
WBC: 10.3 10*3/uL (ref 4.0–10.5)

## 2011-07-07 LAB — COMPREHENSIVE METABOLIC PANEL
AST: 15 U/L (ref 0–37)
BUN: 5 mg/dL — ABNORMAL LOW (ref 6–23)
CO2: 28 mEq/L (ref 19–32)
Chloride: 106 mEq/L (ref 96–112)
Creatinine, Ser: 0.81 mg/dL (ref 0.50–1.10)
GFR calc Af Amer: 88 mL/min — ABNORMAL LOW (ref >90)
GFR calc non Af Amer: 76 mL/min — ABNORMAL LOW (ref >90)
Glucose, Bld: 92 mg/dL (ref 70–99)
Total Bilirubin: 0.2 mg/dL — ABNORMAL LOW (ref 0.3–1.2)

## 2011-07-07 MED ORDER — SODIUM CHLORIDE 0.9 % IV BOLUS (SEPSIS): 1000.0000 mL | Freq: Once | INTRAVENOUS | Status: DC

## 2011-07-07 MED ORDER — POTASSIUM CHLORIDE CRYS ER 20 MEQ PO TBCR
40.0000 meq | EXTENDED_RELEASE_TABLET | Freq: Once | ORAL | Status: AC
  Administered 2011-07-07: 40 meq via ORAL
  Filled 2011-07-07: qty 2

## 2011-07-07 MED ORDER — POTASSIUM CHLORIDE 10 MEQ/100ML IV SOLN
10.0000 meq | Freq: Once | INTRAVENOUS | Status: DC
  Filled 2011-07-07: qty 100

## 2011-07-07 NOTE — ED Notes (Addendum)
Pt reports she had back surgery 06/16/2011, pt reports "watery" diarrhea after every meal x2 weeks, pt seen pcp Tuesday, sent in a sample to lab for C-diff, pt reports she has not received any f/u reports from stool sample, pt reports her diarrhea continues and began vomiting this am, pt started on Flagyl on Tuesday w/no relief. Pt reports increased weakness

## 2011-07-07 NOTE — ED Provider Notes (Signed)
History     CSN: [id number]  Arrival date & time 07/07/11  1753   First MD Initiated Contact with Patient 07/07/11 2154      Chief Complaint  Patient presents with  . Diarrhea    HPI  History provided by the patient. Patient is a 61 year old female with history of hypertension, chronic back pain with prior low back surgeries presents with complaints of persistent diarrhea symptoms. Patient reports having low-back procedure performed on May 17 to remove "hardware" by Dr. Yetta Barre. Shortly after that time patient developed persistent watery diarrhea. Patient reports having multiple episodes of diarrhea daily. Symptoms are increased whenever she eats surgery next. Patient denies any blood or mucus in stool. Patient went back to Dr. Yetta Barre who sent her for C. difficile testing last Thursday 8 days ago. Patient states she has not heard results from this test. She did call her GI specialist last Tuesday and started taking Flagyl 2 times a day. This has not helped. Patient also reports having episodes of nausea vomiting last night and twice today. Patient denies any fever, chills, sweats.    Past Medical History  Diagnosis Date  . PONV (postoperative nausea and vomiting)   . Hypertension   . Pneumonia     YEARS AGO  . Neuromuscular disorder     Past Surgical History  Procedure Date  . Back surgery 2005    LOW BACK X 3  . Tonsillectomy   . 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 Appendectomy 60'S  . Abdominal hysterectomy 1976  . Tubal ligation 73  . Cholecystectomy 2001    Family History  Problem Relation Age of Onset  . Anesthesia problems Mother     History  Substance Use Topics  . Smoking status: Current Everyday Smoker -- 0.5 packs/day  . Smokeless tobacco: Not on file  . Alcohol Use: No    OB History    Grav Para Term Preterm Abortions TAB SAB Ect Mult Living                  Review of Systems  Constitutional: Negative for fever and chills.  HENT:  Negative for neck pain.   Respiratory: Negative for shortness of breath.   Cardiovascular: Negative for chest pain.  Gastrointestinal: Positive for nausea, vomiting and diarrhea. Negative for abdominal pain and constipation.  Genitourinary: Negative for dysuria, frequency, hematuria and flank pain.  Musculoskeletal: Negative for back pain.  Neurological: Positive for headaches. Negative for dizziness and light-headedness.    Allergies  Codeine; Hydrocodone; Nsaids; Stadol; Sulfa antibiotics; and Vimovo  Home Medications   Current Outpatient Rx  Name Route Sig Dispense Refill  . DULOXETINE HCL 60 MG PO CPEP Oral Take 60 mg by mouth 2 (two) times daily.    Marland Kitchen ESTAZOLAM 1 MG PO TABS Oral Take 1 mg by mouth at bedtime.    Marland Kitchen HYDROCODONE-ACETAMINOPHEN 10-650 MG PO TABS Oral Take 1-2 tablets by mouth every 6 (six) hours as needed. pain    . HYDROXYZINE HCL 25 MG PO TABS Oral Take 25 mg by mouth 3 (three) times daily as needed. For nausea    . LIOTHYRONINE SODIUM 25 MCG PO TABS Oral Take 37.5 mcg by mouth daily.    Marland Kitchen LORAZEPAM 1 MG PO TABS Oral Take 1 mg by mouth every 8 (eight) hours as needed. For anxiety    . METRONIDAZOLE 500 MG PO TABS Oral Take 500 mg by mouth 3 (three) times daily.    Marland Kitchen  OLMESARTAN-AMLODIPINE-HCTZ 40-5-25 MG PO TABS Oral Take 0.5 tablets by mouth daily.    Marland Kitchen PRAVASTATIN SODIUM 40 MG PO TABS Oral Take 40 mg by mouth daily.    Marland Kitchen PROMETHAZINE HCL 25 MG PO TABS Oral Take 25-50 mg by mouth every 6 (six) hours as needed. For nausea    . RALOXIFENE HCL 60 MG PO TABS Oral Take 60 mg by mouth daily.    Marland Kitchen RANITIDINE HCL 300 MG PO TABS Oral Take 300 mg by mouth 2 (two) times daily.      BP 137/84  Pulse 78  Temp(Src) 98.6 F (37 C) (Oral)  Resp 18  SpO2 97%  Physical Exam  Nursing note and vitals reviewed. Constitutional: She is oriented to person, place, and time. She appears well-developed and well-nourished. No distress.  HENT:  Head: Normocephalic and atraumatic.    Neck: Normal range of motion. Neck supple.       No meningeal signs  Cardiovascular: Normal rate and regular rhythm.   Pulmonary/Chest: Effort normal and breath sounds normal. No respiratory distress. She has no wheezes. She has no rales.  Abdominal: Soft. Bowel sounds are normal. She exhibits no distension. There is no tenderness. There is no rebound and no guarding.  Musculoskeletal:       Well-healing lower back incision. Incision C/D/I.  No erythema of skin or tenderness.  Neurological: She is alert and oriented to person, place, and time.  Skin: Skin is dry. No rash noted. There is pallor.  Psychiatric: She has a normal mood and affect. Her behavior is normal.    ED Course  Procedures   Results for orders placed during the hospital encounter of 07/07/11  CBC      Component Value Range   WBC 10.3  4.0 - 10.5 (K/uL)   RBC 4.36  3.87 - 5.11 (MIL/uL)   Hemoglobin 13.1  12.0 - 15.0 (g/dL)   HCT 16.1  09.6 - 04.5 (%)   MCV 90.6  78.0 - 100.0 (fL)   MCH 30.0  26.0 - 34.0 (pg)   MCHC 33.2  30.0 - 36.0 (g/dL)   RDW 40.9  81.1 - 91.4 (%)   Platelets 253  150 - 400 (K/uL)  COMPREHENSIVE METABOLIC PANEL      Component Value Range   Sodium 144  135 - 145 (mEq/L)   Potassium 3.0 (*) 3.5 - 5.1 (mEq/L)   Chloride 106  96 - 112 (mEq/L)   CO2 28  19 - 32 (mEq/L)   Glucose, Bld 92  70 - 99 (mg/dL)   BUN 5 (*) 6 - 23 (mg/dL)   Creatinine, Ser 7.82  0.50 - 1.10 (mg/dL)   Calcium 9.2  8.4 - 95.6 (mg/dL)   Total Protein 6.8  6.0 - 8.3 (g/dL)   Albumin 3.6  3.5 - 5.2 (g/dL)   AST 15  0 - 37 (U/L)   ALT 11  0 - 35 (U/L)   Alkaline Phosphatase 73  39 - 117 (U/L)   Total Bilirubin 0.2 (*) 0.3 - 1.2 (mg/dL)   GFR calc non Af Amer 76 (*) >90 (mL/min)   GFR calc Af Amer 88 (*) >90 (mL/min)       1. Diarrhea       MDM  Patient seen and evaluated. Patient no acute distress.  Patient with slight hypokalemia. Potassium given. Patient does not wish to have IV for fluids. Patient is  tolerating by mouth fluids. Patient is unable to give stool sample. Patient did  provide a sample to her doctors. Patient is also currently taking Flagyl. Patient is requesting to return home. At this time we'll discharge home.      Angus Seller, [place] 07/08/11 [phone]

## 2011-07-07 NOTE — Discharge Instructions (Signed)
You were seen and evaluated for your symptoms of persistent diarrhea. Your potassium level was slightly low today you were given potassium vitamin. It is recommended that you continue to drink plenty of fluids and eat foods rich in potassium. Please followup with your doctors for continued evaluation and treatment of your diarrhea symptoms. Use your Phenergan as prescribed by your doctors for any nausea vomiting. Return to emergency room for any worsening symptoms.   Diarrhea Infections caused by germs (bacterial) or a virus commonly cause diarrhea. Your caregiver has determined that with time, rest and fluids, the diarrhea should improve. In general, eat normally while drinking more water than usual. Although water may prevent dehydration, it does not contain salt and minerals (electrolytes). Broths, weak tea without caffeine and oral rehydration solutions (ORS) replace fluids and electrolytes. Small amounts of fluids should be taken frequently. Large amounts at one time may not be tolerated. Plain water may be harmful in infants and the elderly. Oral rehydrating solutions (ORS) are available at pharmacies and grocery stores. ORS replace water and important electrolytes in proper proportions. Sports drinks are not as effective as ORS and may be harmful due to sugars worsening diarrhea.  ORS is especially recommended for use in children with diarrhea. As a general guideline for children, replace any new fluid losses from diarrhea and/or vomiting with ORS as follows:   If your child weighs 22 pounds or under (10 kg or less), give 60-120 mL ( -  cup or 2 - 4 ounces) of ORS for each episode of diarrheal stool or vomiting episode.   If your child weighs more than 22 pounds (more than 10 kgs), give 120-240 mL ( - 1 cup or 4 - 8 ounces) of ORS for each diarrheal stool or episode of vomiting.   While correcting for dehydration, children should eat normally. However, foods high in sugar should be avoided  because this may worsen diarrhea. Large amounts of carbonated soft drinks, juice, gelatin desserts and other highly sugared drinks should be avoided.   After correction of dehydration, other liquids that are appealing to the child may be added. Children should drink small amounts of fluids frequently and fluids should be increased as tolerated. Children should drink enough fluids to keep urine clear or pale yellow.   Adults should eat normally while drinking more fluids than usual. Drink small amounts of fluids frequently and increase as tolerated. Drink enough fluids to keep urine clear or pale yellow. Broths, weak decaffeinated tea, lemon lime soft drinks (allowed to go flat) and ORS replace fluids and electrolytes.   Avoid:   Carbonated drinks.   Juice.   Extremely hot or cold fluids.   Caffeine drinks.   Fatty, greasy foods.   Alcohol.   Tobacco.   Too much intake of anything at one time.   Gelatin desserts.   Probiotics are active cultures of beneficial bacteria. They may lessen the amount and number of diarrheal stools in adults. Probiotics can be found in yogurt with active cultures and in supplements.   Wash hands well to avoid spreading bacteria and virus.   Anti-diarrheal medications are not recommended for infants and children.   Only take over-the-counter or prescription medicines for pain, discomfort or fever as directed by your caregiver. Do not give aspirin to children because it may cause Reye's Syndrome.   For adults, ask your caregiver if you should continue all prescribed and over-the-counter medicines.   If your caregiver has given you a follow-up appointment, it  is very important to keep that appointment. Not keeping the appointment could result in a chronic or permanent injury, and disability. If there is any problem keeping the appointment, you must call back to this facility for assistance.  SEEK IMMEDIATE MEDICAL CARE IF:   You or your child is unable  to keep fluids down or other symptoms or problems become worse in spite of treatment.   Vomiting or diarrhea develops and becomes persistent.   There is vomiting of blood or bile (green material).   There is blood in the stool or the stools are black and tarry.   There is no urine output in 6-8 hours or there is only a small amount of very dark urine.   Abdominal pain develops, increases or localizes.   You have a fever.   Your baby is older than 3 months with a rectal temperature of 102 F (38.9 C) or higher.   Your baby is 26 months old or younger with a rectal temperature of 100.4 F (38 C) or higher.   You or your child develops excessive weakness, dizziness, fainting or extreme thirst.   You or your child develops a rash, stiff neck, severe headache or become irritable or sleepy and difficult to awaken.  MAKE SURE YOU:   Understand these instructions.   Will watch your condition.   Will get help right away if you are not doing well or get worse.  Document Released: 01/06/2002 Document Revised: 01/05/2011 Document Reviewed: 11/23/2008 [facility] Patient Information 2012 [place], [place].    Foods Rich in Potassium Food / Potassium (mg)  Apricots, dried,  cup / 378 mg   Apricots, raw, 1 cup halves / 401 mg   Avocado,  / 487 mg   Banana, 1 large / 487 mg   Beef, lean, round, 3 oz / 202 mg   Cantaloupe, 1 cup cubes / 427 mg   Dates, medjool, 5 whole / 835 mg   Ham, cured, 3 oz / 212 mg   Lentils, dried,  cup / 458 mg   Lima beans, frozen,  cup / 258 mg   Orange, 1 large / 333 mg   Orange juice, 1 cup / 443 mg   Peaches, dried,  cup / 398 mg   Peas, split, cooked,  cup / 355 mg   Potato, boiled, 1 medium / 515 mg   Prunes, dried, uncooked,  cup / 318 mg   Raisins,  cup / 309 mg   Salmon, pink, raw, 3 oz / 275 mg   Sardines, canned , 3 oz / 338 mg   Tomato, raw, 1 medium / 292 mg   Tomato juice, 6 oz / 417 mg   [place], 3 oz / 349 mg    Document Released: 01/16/2005 Document Revised: 09/28/2010 Document Reviewed: 06/01/2008 [facility] Patient Information 2012 [place], [place].    RESOURCE GUIDE  Chronic Pain Problems: Contact [facility]  [phone] Patients need to be referred by their primary care doctor.  Insufficient Money for Medicine: Contact United Way:  call "211" or [facility] [phone].  No Primary Care Doctor: - Call [facility] Connect  [phone] - can help you locate a primary care doctor that  accepts your insurance, provides certain services, etc. - Physician Referral Service- [phone]  Agencies that provide inexpensive medical care: - [facility]  [phone] - [facility]  [phone] - [facility]  [phone] - Women's Clinic  [phone] -  Planned Parenthood  [phone] [facility]  [phone]  Medicaid-accepting [facility] Providers: - [facility]- [address]  [phone], Mon-Fri 9am-7pm, Sat 9am-1pm - [facility]- [address] [place], [place]  [phone] - [facility]- [address], [address] [place]  [phone] [facility]- [address]  [phone] - Renaye Rakers- [address] [place], [address], [phone]  Only accepts [place] patients after they have their name  applied to their card  Self Pay (no insurance) in [place]: - Sickle Cell Patients: Dr Willey Blade, [facility] Internal [facility]  [address] [place], [phone] - [facility] Urgent Care- [address] [place]  [phone]       [facility] [place]- [address]       -     [facility]- see information above (Speak to [employer] if you do not have insurance)       -  [facility]- [address] [place], [phone]       -  [facility]- [address],  [phone]       -  [facility]- [address] [phone]       -  Dr Julio Sicks-  [address], [place], [phone]       -  [facility]- [address] [phone]       -  [facility]- [address] [phone], also [address], [phone]       -    [facility]- [address] [place], [phone], 1st & 3rd Saturday   every month, 10am-1pm  1) Find a Doctor and Pay Out of Pocket Although you won't have to find out who is covered by your insurance plan, it is a good idea to ask around and get recommendations. You will then need to call the office and see if the doctor you have chosen will accept you as a new patient and what types of options they offer for patients who are self-pay. Some doctors offer discounts or will set up payment plans for their patients who do not have insurance, but you will need to ask so you aren't surprised when you get to your appointment.  2) Contact Your Local Health Department Not all health departments have doctors that can see patients for sick visits, but many do, so it is worth a call to see if yours does. If you don't know where your local health department is, you can check in your phone book. The CDC also has a tool to help you locate your state's health department, and many state websites also have listings of all of their local health departments.  3) Find a Walk-in Clinic If your illness is not likely to be very severe or complicated, you may want to try a walk in clinic. These are popping up all over the country in pharmacies, drugstores, and shopping centers. They're usually staffed by nurse practitioners or physician assistants that have been trained to treat common illnesses and complaints. They're usually fairly quick and inexpensive. However, if you have serious medical issues or chronic medical problems, these are probably not your best option  STD Testing - [facility] Department of [facility],  STD Clinic, [address],  [place], phone [phone] or [phone].  Monday - Friday, call for an appointment. [facility] Department of [employer], STD Clinic, [address], [place], phone [phone] or [phone].  Monday - Friday, call for an appointment.  Abuse/Neglect: [facility] Child Abuse Hotline ([phone] [facility] Child Abuse Hotline 276-175-2154 (After Hours)  Emergency Shelter:  [facility] 347-822-7728  Maternity Homes: - [facility] [place] [facility] [phone] - [facility] (431)[phone]  MRSA Hotline #:   458-271-8547  [facility] Resources  [facility]  United Way [facility] Dept. [address]                 [address]         [address] [place] [address]  Blondell Reveal Phone:  [phone]                                  Phone:  [phone]                   Phone:  [phone]  [facility] Mental Health, [phone] - [phone]       -     [facility] in [place], [address],                                  ([phone], [facility] Child Abuse Hotline (763) 016-2563 or 5863428129 (After Hours)   Behavioral Health Services  Substance Abuse Resources: - [facility] and Drug Services  [phone] - [facility] [phone] - [facility] [place] [phone] Floydene Flock [phone] - Residential & Outpatient Substance Abuse Program  (905)888-2007  Psychological Services: [facility]  6263222315 Services  [phone] - [facility], [phone] [place]. [address] [place], ACCESS LINE: 8567442922 or [phone], [email or web address]  Dental  Assistance  If unable to pay or uninsured, contact:  [facility] or [facility]. to become qualified for the adult dental clinic.  Patients with Medicaid: [facility] [phone] [address], [phone] [address], [phone]  If unable to pay, or uninsured, contact [facility] [phone]) or [facility] Department [phone] in [place], [phone] in [facility]) to become qualified for the adult dental clinic  Other Low-Cost Community Dental Services: - [facility]- [address] [place], [place], [place], [address], [phone], [phone], 2nd and 4th Thursday of the month at 6:30am.  10 clients each day by appointment, can sometimes see walk-in patients if someone does not show for an appointment. [facility]- [address] Ether Griffins [place], [place], [address], [phone] - [facility]- [address], [place], [place], [address], [phone] [facility] Health Department- [phone] - Berton Lan  [facility] Health Department- [phone] [facility] Department- [phone]

## 2011-07-07 NOTE — ED Notes (Signed)
Last Thursday, started to have watery diarrhea, went to Dr Yetta Barre on Monday- was tested for C-diff- has not gotten results yet; pt then started to vomiting yesterday; started flagyl on 6/4 by PCP, with no relief

## 2011-07-08 ENCOUNTER — Encounter (HOSPITAL_COMMUNITY): Payer: Self-pay

## 2011-07-08 ENCOUNTER — Emergency Department (HOSPITAL_COMMUNITY)
Admission: EM | Admit: 2011-07-08 | Discharge: 2011-07-08 | Disposition: A | Discharge status: Home / Self Care | Payer: BC Managed Care – PPO | Attending: Emergency Medicine | Admitting: Emergency Medicine

## 2011-07-08 DIAGNOSIS — R197 Diarrhea, unspecified: Secondary | ICD-10-CM | POA: Insufficient documentation

## 2011-07-08 DIAGNOSIS — K529 Noninfective gastroenteritis and colitis, unspecified: Secondary | ICD-10-CM

## 2011-07-08 DIAGNOSIS — Z79899 Other long term (current) drug therapy: Secondary | ICD-10-CM | POA: Insufficient documentation

## 2011-07-08 DIAGNOSIS — E860 Dehydration: Secondary | ICD-10-CM | POA: Insufficient documentation

## 2011-07-08 DIAGNOSIS — F17200 Nicotine dependence, unspecified, uncomplicated: Secondary | ICD-10-CM | POA: Insufficient documentation

## 2011-07-08 DIAGNOSIS — I10 Essential (primary) hypertension: Secondary | ICD-10-CM | POA: Insufficient documentation

## 2011-07-08 DIAGNOSIS — R5381 Other malaise: Secondary | ICD-10-CM | POA: Insufficient documentation

## 2011-07-08 DIAGNOSIS — K5289 Other specified noninfective gastroenteritis and colitis: Secondary | ICD-10-CM | POA: Diagnosis not present

## 2011-07-08 DIAGNOSIS — E876 Hypokalemia: Secondary | ICD-10-CM | POA: Diagnosis not present

## 2011-07-08 LAB — BASIC METABOLIC PANEL
BUN: 6 mg/dL (ref 6–23)
CO2: 27 mEq/L (ref 19–32)
Calcium: 9.1 mg/dL (ref 8.4–10.5)
Glucose, Bld: 88 mg/dL (ref 70–99)
Sodium: 141 mEq/L (ref 135–145)

## 2011-07-08 MED ORDER — POTASSIUM CHLORIDE ER 10 MEQ PO TBCR: 20.0000 meq | EXTENDED_RELEASE_TABLET | Freq: Two times a day (BID) | ORAL | Status: DC

## 2011-07-08 MED ORDER — SODIUM CHLORIDE 0.9 % IV BOLUS (SEPSIS)
1000.0000 mL | Freq: Once | INTRAVENOUS | Status: AC
  Administered 2011-07-08: 1000 mL via INTRAVENOUS

## 2011-07-08 MED ORDER — POTASSIUM CHLORIDE CRYS ER 20 MEQ PO TBCR
40.0000 meq | EXTENDED_RELEASE_TABLET | Freq: Once | ORAL | Status: AC
  Administered 2011-07-08: 40 meq via ORAL
  Filled 2011-07-08: qty 2

## 2011-07-08 NOTE — ED Provider Notes (Signed)
Medical screening examination/treatment/procedure(s) were performed by non-physician practitioner and as supervising physician I was immediately available for consultation/collaboration.   Carleene Cooper III, MD 07/08/11 [phone]

## 2011-07-08 NOTE — ED Provider Notes (Addendum)
History     CSN: [id number]  Arrival date & time 07/08/11  1409   First MD Initiated Contact with Patient 07/08/11 1510      Chief Complaint  Patient presents with  . Diarrhea    (Consider location/radiation/quality/duration/timing/severity/associated sxs/prior treatment) HPI Complains of diarrhea onset 9 days ago following lumbar surgery. Patient has had multiple episodes of diarrhea each day since surgery. Though her husband reports that it has started to slow. No blood per rectum maximum temperature 99. Patient has been treated with Imodium and then stopped she has also been treated with starting 5 days ago which was discontinued 2 days ago as her gastroenterologist Dr. Dulce Sellar. It might be contributing to nausea. She vomited one time this morning. No nausea now since treatment with Phenergan. She is also been treated with probiotics She complains of "lethargy generalized weakness and continued diarrhea. Denies abdominal pain. Nothing makes symptoms better or worse [facility] and released. Presents here as she can have continued diarrhea. She spoke with Dr. Dulce Sellar who suggested she come to the hospital and provide a stool sample Past Medical History  Diagnosis Date  . PONV (postoperative nausea and vomiting)   . Hypertension   . Pneumonia     YEARS AGO  . Neuromuscular disorder     Past Surgical History  Procedure Date  . Back surgery 2005    LOW BACK X 3  . Tonsillectomy   . 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 Appendectomy 60'S  . Abdominal hysterectomy 1976  . Tubal ligation 73  . Cholecystectomy 2001    Family History  Problem Relation Age of Onset  . Anesthesia problems Mother     History  Substance Use Topics  . Smoking status: Current Everyday Smoker -- 0.5 packs/day  . Smokeless tobacco: Not on file  . Alcohol Use: No    OB History    Grav Para Term Preterm Abortions TAB SAB Ect Mult Living                Review of Systems  Constitutional: Positive for fatigue.  HENT: Negative.   Respiratory: Negative.   Cardiovascular: Negative.   Gastrointestinal: Positive for nausea, vomiting and diarrhea.  Musculoskeletal: Negative.   Skin: Negative.   Neurological: Negative.   Hematological: Negative.   Psychiatric/Behavioral: Negative.     Allergies  Codeine; Hydrocodone; Nsaids; Stadol; Sulfa antibiotics; and Vimovo  Home Medications   Current Outpatient Rx  Name Route Sig Dispense Refill  . DULOXETINE HCL 60 MG PO CPEP Oral Take 60 mg by mouth 2 (two) times daily.    Marland Kitchen ESTAZOLAM 1 MG PO TABS Oral Take 1 mg by mouth at bedtime.    Marland Kitchen HYDROCODONE-ACETAMINOPHEN 10-650 MG PO TABS Oral Take 1-2 tablets by mouth every 6 (six) hours as needed. pain    . HYDROXYZINE HCL 25 MG PO TABS Oral Take 25 mg by mouth 3 (three) times daily as needed. For nausea    . LIOTHYRONINE SODIUM 25 MCG PO TABS Oral Take 37.5 mcg by mouth daily.    Marland Kitchen LORAZEPAM 1 MG PO TABS Oral Take 1 mg by mouth every 8 (eight) hours as needed. For anxiety    . METRONIDAZOLE 500 MG PO TABS Oral Take 500 mg by mouth 3 (three) times daily.    Marland Kitchen OLMESARTAN-AMLODIPINE-HCTZ 40-5-25 MG PO TABS Oral Take 0.5 tablets by mouth daily.    Marland Kitchen PRAVASTATIN  SODIUM 40 MG PO TABS Oral Take 40 mg by mouth daily.    Marland Kitchen PROMETHAZINE HCL 25 MG PO TABS Oral Take 25-50 mg by mouth every 6 (six) hours as needed. For nausea    . RALOXIFENE HCL 60 MG PO TABS Oral Take 60 mg by mouth daily.    Marland Kitchen RANITIDINE HCL 300 MG PO TABS Oral Take 300 mg by mouth 2 (two) times daily.      BP 111/90  Pulse 84  Temp(Src) 98.6 F (37 C) (Oral)  Resp 18  SpO2 97%  Physical Exam  Nursing note and vitals reviewed. Constitutional: She appears well-developed and well-nourished.  HENT:  Head: Normocephalic and atraumatic.  Eyes: Conjunctivae are normal. Pupils are equal, round, and reactive to light.  Neck: Neck supple. No tracheal deviation present. No  thyromegaly present.  Cardiovascular: Normal rate and regular rhythm.   No murmur heard. Pulmonary/Chest: Effort normal and breath sounds normal.  Abdominal: Soft. Bowel sounds are normal. She exhibits no distension. There is no tenderness.  Musculoskeletal: Normal range of motion. She exhibits no edema and no tenderness.  Neurological: She is alert. Coordination normal.  Skin: Skin is warm and dry. No rash noted.  Psychiatric: She has a normal mood and affect.    ED Course  Procedures (including critical care time) 6:15 PM feels improved after treatment with intravenous saline  Labs Reviewed  STOOL CULTURE  CLOSTRIDIUM DIFFICILE BY PCR  BASIC METABOLIC PANEL  MAGNESIUM   No results found.   No diagnosis found.  Results for orders placed during the hospital encounter of 07/08/11  CLOSTRIDIUM DIFFICILE BY PCR      Component Value Range   C difficile by pcr NEGATIVE  NEGATIVE   BASIC METABOLIC PANEL      Component Value Range   Sodium 141  135 - 145 (mEq/L)   Potassium 2.9 (*) 3.5 - 5.1 (mEq/L)   Chloride 104  96 - 112 (mEq/L)   CO2 27  19 - 32 (mEq/L)   Glucose, Bld 88  70 - 99 (mg/dL)   BUN 6  6 - 23 (mg/dL)   Creatinine, Ser 1.61  0.50 - 1.10 (mg/dL)   Calcium 9.1  8.4 - 09.6 (mg/dL)   GFR calc non Af Amer 73 (*) >90 (mL/min)   GFR calc Af Amer 85 (*) >90 (mL/min)  MAGNESIUM      Component Value Range   Magnesium 2.0  1.5 - 2.5 (mg/dL)   Ct Chest W Contrast  06/15/2011  *RADIOLOGY REPORT*  Clinical Data: Preoperative chest radiograph demonstrated possible left lower nodule.  CT CHEST WITH CONTRAST  Technique:  Multidetector CT imaging of the chest was performed following the standard protocol during bolus administration of intravenous contrast.  Contrast: 75mL OMNIPAQUE IOHEXOL 300 MG/ML  SOLN  Comparison: 06/07/2011  Findings: Small mediastinal and hilar lymph nodes are not pathologically enlarged by size criteria.  Very minimal thoracic aortic atherosclerotic  calcification noted.  Minimal biapical pleuroparenchymal scarring noted.  Pleural-based nodule in the right lower lobe measures 0.5 x 0.7 cm on image 33 of series 3.  A specific nodule corresponding to the finding on conventional radiography is not apparent, and I suspect that the conventional radiographic appearance represented a confluence of vascular osseous shadows.  IMPRESSION:  1.  No nodule in the left lower lobe correspond with the conventional radiographic findings is observed.  This likely represented artifact. 2.  However, there is a 6 mm pleural-based nodule in the right lower  lobe on image 33 of series 3. If the patient is at high risk for bronchogenic carcinoma, follow-up chest CT at 6-12 months is recommended.  If the patient is at low risk for bronchogenic carcinoma, follow-up chest CT at 12 months is recommended.  This recommendation follows the consensus statement: Guidelines for Management of Small Pulmonary Nodules Detected on CT Scans: A Statement from the Fleischner Society as published in Radiology 2005; 237:395-400.  Original Report Authenticated By: Dellia Cloud, M.D.     MDM  Spoke with Dr. Dulce Sellar plan Imodium right ear as directed for diarrhea , stool was sent for ova and parasites as patient has well water,, pending stool culture pending Stop tribenzocor for 2 days Prescription potassium chloride 20 mEq daily for 3 days Patient to see Dr.Ganem in the office on 07/10/2011 Diagnosis #1 enteritis #2 dehydration #3 hypokalemia        Doug Sou, MD 07/08/11 1823  Doug Sou, MD 07/08/11 Harrietta Guardian  Doug Sou, MD 07/08/11 9811

## 2011-07-08 NOTE — ED Notes (Signed)
Pt in from home with diarrhea for several days states test for c-diff was done but has not resulted pt states has also had nausea denies abd pain

## 2011-07-08 NOTE — Discharge Instructions (Signed)
Diet for Diarrhea, Adult You'll need to bring a stool sample back to the hospital to check for parasites. See Dr. Evette Cristal in the office in 2 days. Tribenzocor for the next 2 days. Ask Dr. Evette Cristal to recheck your blood potassium level. After your next stool sample you may start Imodium  as directed. Return if your condition worsens for any reason Having frequent, runny stools (diarrhea) has many causes. Diarrhea may be caused or worsened by food or drink. Diarrhea may be relieved by changing your diet. IF YOU ARE NOT TOLERATING SOLID FOODS:  Drink enough water and fluids to keep your urine clear or pale yellow.   Avoid sugary drinks and sodas as well as milk-based beverages.   Avoid beverages containing caffeine and alcohol.   You may try rehydrating beverages. You can make your own by following this recipe:    tsp table salt.    tsp baking soda.   ? tsp salt substitute (potassium chloride).   1 tbs + 1 tsp sugar.   1 qt water.  As your stools become more solid, you can start eating solid foods. Add foods one at a time. If a certain food causes your diarrhea to get worse, avoid that food and try other foods. A low fiber, low-fat, and lactose-free diet is recommended. Small, frequent meals may be better tolerated.  Starches  Allowed:  White, [place], and pita breads, plain rolls, buns, bagels. Plain muffins, matzo. Soda, saltine, or graham crackers. Pretzels, melba toast, zwieback. Cooked cereals made with water: cornmeal, farina, cream cereals. Dry cereals: refined corn, wheat, rice. Potatoes prepared any way without skins, refined macaroni, spaghetti, noodles, refined rice.   Avoid:  Bread, rolls, or crackers made with whole wheat, multi-grains, rye, bran seeds, nuts, or coconut. Corn tortillas or taco shells. Cereals containing whole grains, multi-grains, bran, coconut, nuts, or raisins. Cooked or dry oatmeal. Coarse wheat cereals, granola. Cereals advertised as "high-fiber." Potato skins.  Whole grain pasta, wild or brown rice. Popcorn. Sweet potatoes/yams. Sweet rolls, doughnuts, waffles, pancakes, sweet breads.  Vegetables  Allowed: Strained tomato and vegetable juices. Most well-cooked and canned vegetables without seeds. Fresh: Tender lettuce, cucumber without the skin, cabbage, spinach, bean sprouts.   Avoid: Fresh, cooked, or canned: Artichokes, baked beans, beet greens, broccoli, Brussels sprouts, corn, kale, legumes, peas, sweet potatoes. Cooked: Green or red cabbage, spinach. Avoid large servings of any vegetables, because vegetables shrink when cooked, and they contain more fiber per serving than fresh vegetables.  Fruit  Allowed: All fruit juices except prune juice. Cooked or canned: Apricots, applesauce, cantaloupe, cherries, fruit cocktail, grapefruit, grapes, kiwi, mandarin oranges, peaches, pears, plums, watermelon. Fresh: Apples without skin, ripe banana, grapes, cantaloupe, cherries, grapefruit, peaches, oranges, plums. Keep servings limited to  cup or 1 piece.   Avoid: Fresh: Apple with skin, apricots, mango, pears, raspberries, strawberries. Prune juice, stewed or dried prunes. Dried fruits, raisins, dates. Large servings of all fresh fruits.  Meat and Meat Substitutes  Allowed: Ground or well-cooked tender beef, ham, veal, lamb, pork, or poultry. Eggs, plain cheese. Fish, oysters, shrimp, lobster, other seafoods. Liver, organ meats.   Avoid: Tough, fibrous meats with gristle. Peanut butter, smooth or chunky. Cheese, nuts, seeds, legumes, dried peas, beans, lentils.  Milk  Allowed: Yogurt, lactose-free milk, kefir, drinkable yogurt, buttermilk, soy milk.   Avoid: Milk, chocolate milk, beverages made with milk, such as milk shakes.  Soups  Allowed: Bouillon, broth, or soups made from allowed foods. Any strained soup.   Avoid: Soups  made from vegetables that are not allowed, cream or milk-based soups.  Desserts and Sweets  Allowed: Sugar-free gelatin,  sugar-free frozen ice pops made without sugar alcohol.   Avoid: Plain cakes and cookies, pie made with allowed fruit, pudding, custard, cream pie. Gelatin, fruit, ice, sherbet, frozen ice pops. Ice cream, ice milk without nuts. Plain hard candy, honey, jelly, molasses, syrup, sugar, chocolate syrup, gumdrops, marshmallows.  Fats and Oils  Allowed: Avoid any fats and oils.   Avoid: Seeds, nuts, olives, avocados. Margarine, butter, cream, mayonnaise, salad oils, plain salad dressings made from allowed foods. Plain gravy, crisp bacon without rind.  Beverages  Allowed: Water, decaffeinated teas, oral rehydration solutions, sugar-free beverages.   Avoid: Fruit juices, caffeinated beverages (coffee, tea, soda or pop), alcohol, sports drinks, or lemon-lime soda or pop.  Condiments  Allowed: Ketchup, mustard, horseradish, vinegar, cream sauce, cheese sauce, cocoa powder. Spices in moderation: allspice, basil, bay leaves, celery powder or leaves, cinnamon, cumin powder, curry powder, ginger, mace, marjoram, onion or garlic powder, oregano, paprika, parsley flakes, ground pepper, rosemary, sage, savory, tarragon, thyme, turmeric.   Avoid: Coconut, honey.  Weight Monitoring: Weigh yourself every day. You should weigh yourself in the morning after you urinate and before you eat breakfast. Wear the same amount of clothing when you weigh yourself. Record your weight daily. Bring your recorded weights to your clinic visits. Tell your caregiver right away if you have gained 3 lb/1.4 kg or more in 1 day, 5 lb/2.3 kg in a week, or whatever amount you were told to report. SEEK IMMEDIATE MEDICAL CARE IF:   You are unable to keep fluids down.   You start to throw up (vomit) or diarrhea keeps coming back (persistent).   Abdominal pain develops, increases, or can be felt in one place (localizes).   You have an oral temperature above 102 F (38.9 C), not controlled by medicine.   Diarrhea contains blood or  mucus.   You develop excessive weakness, dizziness, fainting, or extreme thirst.  MAKE SURE YOU:   Understand these instructions.   Will watch your condition.   Will get help right away if you are not doing well or get worse.  Document Released: 04/08/2003 Document Revised: 01/05/2011 Document Reviewed: 07/30/2008 [facility] Patient Information 2012 [place], [place].

## 2011-07-11 LAB — OVA AND PARASITE EXAMINATION

## 2011-07-12 LAB — STOOL CULTURE

## 2011-07-31 DIAGNOSIS — R110 Nausea: Secondary | ICD-10-CM | POA: Diagnosis not present

## 2011-07-31 DIAGNOSIS — R197 Diarrhea, unspecified: Secondary | ICD-10-CM | POA: Diagnosis not present

## 2011-08-01 DIAGNOSIS — D1330 Benign neoplasm of unspecified part of small intestine: Secondary | ICD-10-CM | POA: Diagnosis not present

## 2011-08-01 DIAGNOSIS — K2990 Gastroduodenitis, unspecified, without bleeding: Secondary | ICD-10-CM | POA: Diagnosis not present

## 2011-08-01 DIAGNOSIS — K5289 Other specified noninfective gastroenteritis and colitis: Secondary | ICD-10-CM | POA: Diagnosis not present

## 2011-08-01 DIAGNOSIS — K2970 Gastritis, unspecified, without bleeding: Secondary | ICD-10-CM | POA: Diagnosis not present

## 2011-08-01 DIAGNOSIS — R197 Diarrhea, unspecified: Secondary | ICD-10-CM | POA: Diagnosis not present

## 2011-08-01 DIAGNOSIS — R110 Nausea: Secondary | ICD-10-CM | POA: Diagnosis not present

## 2011-08-08 DIAGNOSIS — K5289 Other specified noninfective gastroenteritis and colitis: Secondary | ICD-10-CM | POA: Diagnosis not present

## 2011-08-28 DIAGNOSIS — F321 Major depressive disorder, single episode, moderate: Secondary | ICD-10-CM | POA: Diagnosis not present

## 2011-09-19 DIAGNOSIS — K5289 Other specified noninfective gastroenteritis and colitis: Secondary | ICD-10-CM | POA: Diagnosis not present

## 2011-09-25 DIAGNOSIS — R252 Cramp and spasm: Secondary | ICD-10-CM | POA: Diagnosis not present

## 2011-09-25 DIAGNOSIS — E785 Hyperlipidemia, unspecified: Secondary | ICD-10-CM | POA: Diagnosis not present

## 2011-09-25 DIAGNOSIS — I10 Essential (primary) hypertension: Secondary | ICD-10-CM | POA: Diagnosis not present

## 2011-09-25 DIAGNOSIS — E559 Vitamin D deficiency, unspecified: Secondary | ICD-10-CM | POA: Diagnosis not present

## 2011-09-25 DIAGNOSIS — R946 Abnormal results of thyroid function studies: Secondary | ICD-10-CM | POA: Diagnosis not present

## 2011-11-03 DIAGNOSIS — E039 Hypothyroidism, unspecified: Secondary | ICD-10-CM | POA: Diagnosis not present

## 2011-12-11 DIAGNOSIS — K5289 Other specified noninfective gastroenteritis and colitis: Secondary | ICD-10-CM | POA: Diagnosis not present

## 2011-12-14 ENCOUNTER — Other Ambulatory Visit: Payer: Self-pay | Admitting: Family Medicine

## 2011-12-14 DIAGNOSIS — Z1231 Encounter for screening mammogram for malignant neoplasm of breast: Secondary | ICD-10-CM

## 2012-01-05 DIAGNOSIS — F321 Major depressive disorder, single episode, moderate: Secondary | ICD-10-CM | POA: Diagnosis not present

## 2012-01-10 ENCOUNTER — Ambulatory Visit
Admission: RE | Admit: 2012-01-10 | Discharge: 2012-01-10 | Disposition: A | Discharge status: Home / Self Care | Payer: BC Managed Care – PPO | Source: Ambulatory Visit | Attending: Family Medicine | Admitting: Family Medicine

## 2012-01-10 DIAGNOSIS — Z1231 Encounter for screening mammogram for malignant neoplasm of breast: Secondary | ICD-10-CM

## 2012-01-11 DIAGNOSIS — E559 Vitamin D deficiency, unspecified: Secondary | ICD-10-CM | POA: Diagnosis not present

## 2012-01-11 DIAGNOSIS — E039 Hypothyroidism, unspecified: Secondary | ICD-10-CM | POA: Diagnosis not present

## 2012-02-12 DIAGNOSIS — K5289 Other specified noninfective gastroenteritis and colitis: Secondary | ICD-10-CM | POA: Diagnosis not present

## 2012-03-02 DIAGNOSIS — J111 Influenza due to unidentified influenza virus with other respiratory manifestations: Secondary | ICD-10-CM | POA: Diagnosis not present

## 2012-05-21 DIAGNOSIS — E559 Vitamin D deficiency, unspecified: Secondary | ICD-10-CM | POA: Diagnosis not present

## 2012-05-21 DIAGNOSIS — IMO0002 Reserved for concepts with insufficient information to code with codable children: Secondary | ICD-10-CM | POA: Diagnosis not present

## 2012-05-21 DIAGNOSIS — E039 Hypothyroidism, unspecified: Secondary | ICD-10-CM | POA: Diagnosis not present

## 2012-05-21 DIAGNOSIS — N951 Menopausal and female climacteric states: Secondary | ICD-10-CM | POA: Diagnosis not present

## 2012-05-28 DIAGNOSIS — F321 Major depressive disorder, single episode, moderate: Secondary | ICD-10-CM | POA: Diagnosis not present

## 2012-07-24 DIAGNOSIS — M1710 Unilateral primary osteoarthritis, unspecified knee: Secondary | ICD-10-CM | POA: Diagnosis not present

## 2012-07-24 DIAGNOSIS — M25559 Pain in unspecified hip: Secondary | ICD-10-CM | POA: Diagnosis not present

## 2012-08-06 DIAGNOSIS — K5289 Other specified noninfective gastroenteritis and colitis: Secondary | ICD-10-CM | POA: Diagnosis not present

## 2012-08-07 DIAGNOSIS — M2040 Other hammer toe(s) (acquired), unspecified foot: Secondary | ICD-10-CM | POA: Diagnosis not present

## 2012-08-07 DIAGNOSIS — M2010 Hallux valgus (acquired), unspecified foot: Secondary | ICD-10-CM | POA: Diagnosis not present

## 2012-08-30 DIAGNOSIS — M76899 Other specified enthesopathies of unspecified lower limb, excluding foot: Secondary | ICD-10-CM | POA: Diagnosis not present

## 2012-09-02 DIAGNOSIS — I10 Essential (primary) hypertension: Secondary | ICD-10-CM | POA: Diagnosis not present

## 2012-09-13 DIAGNOSIS — M76899 Other specified enthesopathies of unspecified lower limb, excluding foot: Secondary | ICD-10-CM | POA: Diagnosis not present

## 2012-09-16 DIAGNOSIS — M76899 Other specified enthesopathies of unspecified lower limb, excluding foot: Secondary | ICD-10-CM | POA: Diagnosis not present

## 2012-09-18 DIAGNOSIS — M76899 Other specified enthesopathies of unspecified lower limb, excluding foot: Secondary | ICD-10-CM | POA: Diagnosis not present

## 2012-09-26 DIAGNOSIS — M76899 Other specified enthesopathies of unspecified lower limb, excluding foot: Secondary | ICD-10-CM | POA: Diagnosis not present

## 2012-09-27 DIAGNOSIS — M76899 Other specified enthesopathies of unspecified lower limb, excluding foot: Secondary | ICD-10-CM | POA: Diagnosis not present

## 2012-10-01 DIAGNOSIS — G8918 Other acute postprocedural pain: Secondary | ICD-10-CM | POA: Diagnosis not present

## 2012-10-01 DIAGNOSIS — M2040 Other hammer toe(s) (acquired), unspecified foot: Secondary | ICD-10-CM | POA: Diagnosis not present

## 2012-10-01 DIAGNOSIS — M2010 Hallux valgus (acquired), unspecified foot: Secondary | ICD-10-CM | POA: Diagnosis not present

## 2012-10-16 DIAGNOSIS — Z4789 Encounter for other orthopedic aftercare: Secondary | ICD-10-CM | POA: Diagnosis not present

## 2012-11-19 ENCOUNTER — Telehealth: Payer: Self-pay | Admitting: Endocrinology

## 2012-11-19 ENCOUNTER — Other Ambulatory Visit: Payer: BC Managed Care – PPO

## 2012-11-21 ENCOUNTER — Ambulatory Visit: Payer: BC Managed Care – PPO | Admitting: Endocrinology

## 2012-11-21 DIAGNOSIS — Z0289 Encounter for other administrative examinations: Secondary | ICD-10-CM

## 2012-12-10 DIAGNOSIS — M25519 Pain in unspecified shoulder: Secondary | ICD-10-CM | POA: Diagnosis not present

## 2012-12-31 DIAGNOSIS — F321 Major depressive disorder, single episode, moderate: Secondary | ICD-10-CM | POA: Diagnosis not present

## 2013-01-02 ENCOUNTER — Encounter: Payer: Self-pay | Admitting: Endocrinology

## 2013-01-02 ENCOUNTER — Ambulatory Visit (INDEPENDENT_AMBULATORY_CARE_PROVIDER_SITE_OTHER): Payer: BC Managed Care – PPO | Admitting: Endocrinology

## 2013-01-02 VITALS — BP 126/72 | HR 82 | Temp 98.5°F | Resp 12 | Ht 68.0 in | Wt 188.0 lb

## 2013-01-02 DIAGNOSIS — I10 Essential (primary) hypertension: Secondary | ICD-10-CM | POA: Diagnosis not present

## 2013-01-02 DIAGNOSIS — E039 Hypothyroidism, unspecified: Secondary | ICD-10-CM | POA: Diagnosis not present

## 2013-01-02 LAB — BASIC METABOLIC PANEL
BUN: 7 mg/dL (ref 6–23)
Creatinine, Ser: 0.7 mg/dL (ref 0.4–1.2)
GFR: 92.67 mL/min (ref >60.00)
Potassium: 3.8 mEq/L (ref 3.5–5.1)

## 2013-01-02 LAB — T4, FREE: Free T4: 1.1 ng/dL (ref 0.60–1.60)

## 2013-01-02 NOTE — Patient Instructions (Addendum)
Continue same dosage before breakfast daily unless changed .  Avoid taking any calcium or iron supplements with the thyroid supplement.

## 2013-01-02 NOTE — Progress Notes (Signed)
[name]   Reason for Appointment:  Hypothyroidism, followup visit    History of Present Illness:   The hypothyroidism was first diagnosed in 2004 Her TSH was minimally increased but she was treated with Cytomel by her psychiatrist partly for depression. She was taking 25 mcg twice a day Since 2013 she has been on Synthroid brand name 150 mcg which has helped her subjectively feel better  The patient has been treated with The last visit was in 6/14 when her dose was unchanged and TSH was 1.4   Patient has no complaints of unusual fatigue, cold sensitivity, dry skin, unusual weight gain She does tend to have some fatigue chronically. She has had significant hair loss couple of weeks after her foot surgery in September but this is improving now         The patient is taking the thyroid supplement very regularly in the morning before breakfast.  Not taking any calcium or iron supplements with the thyroid supplement.        Medication List       This list is accurate as of: 01/02/13  8:18 PM.  Always use your most recent med list.               DULoxetine 60 MG capsule  Commonly known as:  CYMBALTA  Take 60 mg by mouth 2 (two) times daily.     estazolam 1 MG tablet  Commonly known as:  PROSOM  Take 1 mg by mouth at bedtime.     HYDROcodone-acetaminophen 10-650 MG per tablet  Commonly known as:  LORCET  Take 1-2 tablets by mouth every 6 (six) hours as needed. pain     hydrOXYzine 25 MG tablet  Commonly known as:  ATARAX/VISTARIL  Take 25 mg by mouth 3 (three) times daily as needed. For nausea     LORazepam 1 MG tablet  Commonly known as:  ATIVAN  Take 1 mg by mouth every 8 (eight) hours as needed. For anxiety     potassium chloride 10 MEQ tablet  Commonly known as:  K-DUR  Take 2 tablets (20 mEq total) by mouth 2 (two) times daily.     promethazine 25 MG tablet  Commonly known as:  PHENERGAN  Take 25-50 mg by mouth every 6 (six) hours as needed. For  nausea     valsartan-hydrochlorothiazide 320-25 MG per tablet  Commonly known as:  DIOVAN-HCT  Take 1 tablet by mouth daily.        Allergies:  Allergies  Allergen Reactions  . Codeine Nausea Only    And fast heart rate  . Hydrocodone Other (See Comments)    Hydrocodones cause headaches except Lorcet 10/650  . Nsaids Swelling    Face swells  . Stadol [Butorphanol Tartrate] Other (See Comments)    Fast heart rate, felt like going to die, trouble breathing.  . Sulfa Antibiotics Nausea Only  . Vimovo [Naproxen-Esomeprazole] Nausea Only    Past Medical History  Diagnosis Date  . PONV (postoperative nausea and vomiting)   . Hypertension   . Pneumonia     YEARS AGO  . Neuromuscular disorder     Past Surgical History  Procedure Laterality Date  . Back surgery  2005    LOW BACK X 3  . Tonsillectomy    . 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 Appendectomy  60'S  . Abdominal hysterectomy  1976  . Tubal ligation  73  .  Cholecystectomy  2001    Family History  Problem Relation Age of Onset  . Anesthesia problems Mother     Social History:  reports that she has been smoking.  She does not have any smokeless tobacco history on file. She reports that she does not drink alcohol or use illicit drugs.  REVIEW Of SYSTEMS:  She was having hot flushes on her last visit but these are better  Her blood pressure medication was changed on her last visit. She however thinks she is tired because of low blood pressure. No dizziness   Examination:   BP 126/72  Pulse 82  Temp(Src) 98.5 F (36.9 C)  Resp 12  Ht 5\' 8"  (1.727 m)  Wt 188 lb (85.276 kg)  BMI 28.59 kg/m2  SpO2 99%  GENERAL APPEARANCE: Alert, no puffiness of the face or eyes  NECK: Thyroid is not palpable           NEUROLOGIC EXAM:  biceps reflexes show normal relaxation Skin: Not unusual dry    Assessment   Hypothyroidism primary and long-standing Difficult to assess her clinically since she  has some fatigue even when she has normal thyroid levels  History of hot flushes, resolved  Hypertension, followed by PCP   Treatment:  To be determined after labs from today are available  [facility] 01/02/2013, 8:18 PM   Addendum: Labs as follows, continue same dosage and followup in 6 months  Office Visit on 01/02/2013  Component Date Value Range Status  . Free T4 01/02/2013 1.10  0.60 - 1.60 ng/dL Final  . TSH 40/98/1191 0.85  0.35 - 5.50 uIU/mL Final  . Sodium 01/02/2013 138  135 - 145 mEq/L Final  . Potassium 01/02/2013 3.8  3.5 - 5.1 mEq/L Final  . Chloride 01/02/2013 101  96 - 112 mEq/L Final  . CO2 01/02/2013 30  19 - 32 mEq/L Final  . Glucose, Bld 01/02/2013 94  70 - 99 mg/dL Final  . BUN 47/82/9562 7  6 - 23 mg/dL Final  . Creatinine, Ser 01/02/2013 0.7  0.4 - 1.2 mg/dL Final  . Calcium 13/08/6576 9.2  8.4 - 10.5 mg/dL Final  . GFR 46/96/2952 92.67  >60.00 mL/min Final

## 2013-01-02 NOTE — Progress Notes (Signed)
Quick Note:  Please let patient know that the lab result is normal , please refill same prescription, followup in 6 months with labs ______

## 2013-01-03 ENCOUNTER — Other Ambulatory Visit: Payer: Self-pay | Admitting: Neurological Surgery

## 2013-01-03 ENCOUNTER — Other Ambulatory Visit: Payer: Self-pay | Admitting: *Deleted

## 2013-01-03 DIAGNOSIS — M542 Cervicalgia: Secondary | ICD-10-CM

## 2013-01-03 MED ORDER — SYNTHROID 150 MCG PO TABS: 150.0000 ug | ORAL_TABLET | Freq: Every day | ORAL | Status: DC

## 2013-01-10 ENCOUNTER — Ambulatory Visit
Admission: RE | Admit: 2013-01-10 | Discharge: 2013-01-10 | Disposition: A | Discharge status: Home / Self Care | Payer: BC Managed Care – PPO | Source: Ambulatory Visit | Attending: Neurological Surgery | Admitting: Neurological Surgery

## 2013-01-10 DIAGNOSIS — M542 Cervicalgia: Secondary | ICD-10-CM

## 2013-02-25 ENCOUNTER — Other Ambulatory Visit: Payer: Self-pay

## 2013-02-25 DIAGNOSIS — Z1231 Encounter for screening mammogram for malignant neoplasm of breast: Secondary | ICD-10-CM

## 2013-03-11 ENCOUNTER — Ambulatory Visit: Payer: BC Managed Care – PPO

## 2013-03-14 ENCOUNTER — Ambulatory Visit: Payer: BC Managed Care – PPO

## 2013-03-27 ENCOUNTER — Ambulatory Visit: Payer: BC Managed Care – PPO

## 2013-04-08 DIAGNOSIS — I10 Essential (primary) hypertension: Secondary | ICD-10-CM | POA: Diagnosis not present

## 2013-04-10 ENCOUNTER — Ambulatory Visit: Payer: BC Managed Care – PPO

## 2013-04-24 ENCOUNTER — Ambulatory Visit
Admission: RE | Admit: 2013-04-24 | Discharge: 2013-04-24 | Disposition: A | Discharge status: Home / Self Care | Payer: BC Managed Care – PPO | Source: Ambulatory Visit

## 2013-04-24 DIAGNOSIS — Z1231 Encounter for screening mammogram for malignant neoplasm of breast: Secondary | ICD-10-CM

## 2013-05-12 NOTE — Telephone Encounter (Signed)
error 

## 2013-07-03 ENCOUNTER — Ambulatory Visit (INDEPENDENT_AMBULATORY_CARE_PROVIDER_SITE_OTHER): Payer: BC Managed Care – PPO | Admitting: Endocrinology

## 2013-07-03 ENCOUNTER — Encounter: Payer: Self-pay | Admitting: Endocrinology

## 2013-07-03 ENCOUNTER — Other Ambulatory Visit: Payer: BC Managed Care – PPO

## 2013-07-03 VITALS — BP 118/60 | HR 78 | Temp 98.1°F | Resp 14 | Ht 68.0 in | Wt 189.4 lb

## 2013-07-03 DIAGNOSIS — E039 Hypothyroidism, unspecified: Secondary | ICD-10-CM | POA: Diagnosis not present

## 2013-07-03 DIAGNOSIS — F321 Major depressive disorder, single episode, moderate: Secondary | ICD-10-CM | POA: Diagnosis not present

## 2013-07-03 LAB — TSH: TSH: 0.76 u[IU]/mL (ref 0.35–4.50)

## 2013-07-03 LAB — T4, FREE: Free T4: 1.06 ng/dL (ref 0.60–1.60)

## 2013-07-03 NOTE — Progress Notes (Signed)
Quick Note:  Please let patient know that the lab result is normal and no change needed ______ 

## 2013-07-03 NOTE — Progress Notes (Signed)
[name] 63 y.o.    Reason for Appointment:  Hypothyroidism, followup visit    History of Present Illness:   The hypothyroidism was first diagnosed in 2004 Her TSH was minimally increased but she was treated with Cytomel by her psychiatrist partly for depression. She was taking 25 mcg twice a day Since 2013 she has been on Synthroid brand name 150 mcg which has helped her subjectively feel better   The last visit was in 12/14 when her dose was unchanged    Patient has no complaints of unusual fatigue, cold sensitivity, dry skin, hair loss or unusual weight gain She does tend to have some fatigue chronically. The patient is taking the thyroid supplement very regularly in the morning one hour before her breakfast.  Not taking any calcium or iron supplements with the thyroid supplement.  She is   taking calcium later in the day   Lab Results  Component Value Date   FREET4 1.10 01/02/2013   TSH 0.85 01/02/2013        Medication List       This list is accurate as of: 07/03/13  8:04 AM.  Always use your most recent med list.               DULoxetine 60 MG capsule  Commonly known as:  CYMBALTA  Take 60 mg by mouth 2 (two) times daily.     estazolam 1 MG tablet  Commonly known as:  PROSOM  Take 1 mg by mouth at bedtime.     HYDROcodone-acetaminophen 10-650 MG per tablet  Commonly known as:  LORCET  Take 1-2 tablets by mouth every 6 (six) hours as needed. pain     hydrOXYzine 25 MG tablet  Commonly known as:  ATARAX/VISTARIL  Take 25 mg by mouth 3 (three) times daily as needed. For nausea     LORazepam 1 MG tablet  Commonly known as:  ATIVAN  Take 1 mg by mouth every 8 (eight) hours as needed. For anxiety     potassium chloride 10 MEQ tablet  Commonly known as:  K-DUR  Take 2 tablets (20 mEq total) by mouth 2 (two) times daily.     promethazine 25 MG tablet  Commonly known as:  PHENERGAN  Take 25-50 mg by mouth every 6 (six) hours as needed. For nausea     SYNTHROID 150 MCG tablet  Generic drug:  levothyroxine  Take 1 tablet (150 mcg total) by mouth daily before breakfast.     valsartan-hydrochlorothiazide 320-25 MG per tablet  Commonly known as:  DIOVAN-HCT  Take 1 tablet by mouth daily.        Allergies:  Allergies  Allergen Reactions  . Codeine Nausea Only    And fast heart rate  . Hydrocodone Other (See Comments)    Hydrocodones cause headaches except Lorcet 10/650  . Nsaids Swelling    Face swells  . Stadol [Butorphanol Tartrate] Other (See Comments)    Fast heart rate, felt like going to die, trouble breathing.  . Sulfa Antibiotics Nausea Only  . Vimovo [Naproxen-Esomeprazole] Nausea Only    Past Medical History  Diagnosis Date  . PONV (postoperative nausea and vomiting)   . Hypertension   . Pneumonia     YEARS AGO  . Neuromuscular disorder     Past Surgical History  Procedure Laterality Date  . Back surgery  2005    LOW BACK X 3  . Tonsillectomy    . Breast surgery  2002  LT BREAST TUMOR REMOVED   . Appendectomy  60'S  . Abdominal hysterectomy  1976  . Tubal ligation  73  . Cholecystectomy  2001    Family History  Problem Relation Age of Onset  . Anesthesia problems Mother     Social History:  reports that she has been smoking.  She does not have any smokeless tobacco history on file. She reports that she does not drink alcohol or use illicit drugs.  REVIEW Of SYSTEMS:  She was having hot flushes on her last visit but these are practically resolved now  Her blood pressure medication has been monitored by her PCP.Takes 1/2 tablet daily and checks her blood pressure at home  She however thinks she is tired because of low blood pressure. No dizziness   Examination:   BP 118/60  Pulse 78  Temp(Src) 98.1 F (36.7 C)  Resp 14  Ht 5\' 8"  (1.727 m)  Wt 189 lb 6.4 oz (85.911 kg)  BMI 28.80 kg/m2  SpO2 96%  GENERAL APPEARANCE: Alert, no puffiness of the face or eyes  NECK: Thyroid is not  palpable           NEUROLOGIC EXAM:  biceps reflexes show normal relaxation Skin: Not unusual dry    Assessment   Hypothyroidism primary and long-standing without a goiter Difficult to assess her clinically since she has some fatigue even when she has normal thyroid levels  History of hot flushes, resolved  Hypertension, followed by PCP   Treatment:  To be determined after thyroid level from today are available  Elayne Snare 07/03/2013, 8:04 AM   Addendum: Labs as follows, continue same dosage and followup in 6 months  No visits with results within 2 Day(s) from this visit. Latest known visit with results is:  Office Visit on 01/02/2013  Component Date Value Ref Range Status  . Free T4 01/02/2013 1.10  0.60 - 1.60 ng/dL Final  . TSH 01/02/2013 0.85  0.35 - 5.50 uIU/mL Final  . Sodium 01/02/2013 138  135 - 145 mEq/L Final  . Potassium 01/02/2013 3.8  3.5 - 5.1 mEq/L Final  . Chloride 01/02/2013 101  96 - 112 mEq/L Final  . CO2 01/02/2013 30  19 - 32 mEq/L Final  . Glucose, Bld 01/02/2013 94  70 - 99 mg/dL Final  . BUN 01/02/2013 7  6 - 23 mg/dL Final  . Creatinine, Ser 01/02/2013 0.7  0.4 - 1.2 mg/dL Final  . Calcium 01/02/2013 9.2  8.4 - 10.5 mg/dL Final  . GFR 01/02/2013 92.67  >60.00 mL/min Final

## 2013-07-03 NOTE — Patient Instructions (Signed)
Continue same dose if the labs are normal today

## 2013-08-13 DIAGNOSIS — N390 Urinary tract infection, site not specified: Secondary | ICD-10-CM | POA: Diagnosis not present

## 2013-08-13 DIAGNOSIS — R300 Dysuria: Secondary | ICD-10-CM | POA: Diagnosis not present

## 2013-08-13 DIAGNOSIS — I959 Hypotension, unspecified: Secondary | ICD-10-CM | POA: Diagnosis not present

## 2013-09-08 ENCOUNTER — Other Ambulatory Visit: Payer: Self-pay | Admitting: *Deleted

## 2013-09-08 MED ORDER — SYNTHROID 150 MCG PO TABS: 150.0000 ug | ORAL_TABLET | Freq: Every day | ORAL | Status: DC

## 2013-10-08 DIAGNOSIS — M2390 Unspecified internal derangement of unspecified knee: Secondary | ICD-10-CM | POA: Diagnosis not present

## 2013-10-08 DIAGNOSIS — M25569 Pain in unspecified knee: Secondary | ICD-10-CM | POA: Diagnosis not present

## 2013-10-18 DIAGNOSIS — R3915 Urgency of urination: Secondary | ICD-10-CM | POA: Diagnosis not present

## 2013-10-18 DIAGNOSIS — R300 Dysuria: Secondary | ICD-10-CM | POA: Diagnosis not present

## 2014-01-08 DIAGNOSIS — F321 Major depressive disorder, single episode, moderate: Secondary | ICD-10-CM | POA: Diagnosis not present

## 2014-02-11 DIAGNOSIS — M1712 Unilateral primary osteoarthritis, left knee: Secondary | ICD-10-CM | POA: Diagnosis not present

## 2014-02-11 DIAGNOSIS — M1711 Unilateral primary osteoarthritis, right knee: Secondary | ICD-10-CM | POA: Diagnosis not present

## 2014-02-11 DIAGNOSIS — M25552 Pain in left hip: Secondary | ICD-10-CM | POA: Diagnosis not present

## 2014-02-11 DIAGNOSIS — M170 Bilateral primary osteoarthritis of knee: Secondary | ICD-10-CM | POA: Diagnosis not present

## 2014-03-03 DIAGNOSIS — M545 Low back pain: Secondary | ICD-10-CM | POA: Diagnosis not present

## 2014-03-03 DIAGNOSIS — Z6829 Body mass index (BMI) 29.0-29.9, adult: Secondary | ICD-10-CM | POA: Diagnosis not present

## 2014-03-09 ENCOUNTER — Other Ambulatory Visit: Payer: Self-pay | Admitting: *Deleted

## 2014-03-09 MED ORDER — SYNTHROID 150 MCG PO TABS: 150.0000 ug | ORAL_TABLET | Freq: Every day | ORAL | Status: AC

## 2014-03-18 DIAGNOSIS — I10 Essential (primary) hypertension: Secondary | ICD-10-CM | POA: Diagnosis not present

## 2014-03-18 DIAGNOSIS — E039 Hypothyroidism, unspecified: Secondary | ICD-10-CM | POA: Diagnosis not present

## 2014-03-18 DIAGNOSIS — J069 Acute upper respiratory infection, unspecified: Secondary | ICD-10-CM | POA: Diagnosis not present

## 2014-04-10 DIAGNOSIS — M549 Dorsalgia, unspecified: Secondary | ICD-10-CM | POA: Diagnosis not present

## 2014-04-10 DIAGNOSIS — E860 Dehydration: Secondary | ICD-10-CM | POA: Diagnosis not present

## 2014-04-10 DIAGNOSIS — R5381 Other malaise: Secondary | ICD-10-CM | POA: Diagnosis not present

## 2014-04-23 DIAGNOSIS — M1711 Unilateral primary osteoarthritis, right knee: Secondary | ICD-10-CM | POA: Diagnosis not present

## 2014-04-23 DIAGNOSIS — M1712 Unilateral primary osteoarthritis, left knee: Secondary | ICD-10-CM | POA: Diagnosis not present

## 2014-04-23 DIAGNOSIS — M25552 Pain in left hip: Secondary | ICD-10-CM | POA: Diagnosis not present

## 2014-05-26 DIAGNOSIS — M170 Bilateral primary osteoarthritis of knee: Secondary | ICD-10-CM | POA: Diagnosis not present

## 2014-05-26 DIAGNOSIS — M1712 Unilateral primary osteoarthritis, left knee: Secondary | ICD-10-CM | POA: Diagnosis not present

## 2014-05-26 DIAGNOSIS — M1711 Unilateral primary osteoarthritis, right knee: Secondary | ICD-10-CM | POA: Diagnosis not present

## 2014-06-02 DIAGNOSIS — N3000 Acute cystitis without hematuria: Secondary | ICD-10-CM | POA: Diagnosis not present

## 2014-06-02 DIAGNOSIS — M1712 Unilateral primary osteoarthritis, left knee: Secondary | ICD-10-CM | POA: Diagnosis not present

## 2014-06-02 DIAGNOSIS — M1711 Unilateral primary osteoarthritis, right knee: Secondary | ICD-10-CM | POA: Diagnosis not present

## 2014-06-02 DIAGNOSIS — M170 Bilateral primary osteoarthritis of knee: Secondary | ICD-10-CM | POA: Diagnosis not present

## 2014-06-08 DIAGNOSIS — Z6830 Body mass index (BMI) 30.0-30.9, adult: Secondary | ICD-10-CM | POA: Diagnosis not present

## 2014-06-08 DIAGNOSIS — H1033 Unspecified acute conjunctivitis, bilateral: Secondary | ICD-10-CM | POA: Diagnosis not present

## 2014-06-08 DIAGNOSIS — M545 Low back pain: Secondary | ICD-10-CM | POA: Diagnosis not present

## 2014-06-09 DIAGNOSIS — M1712 Unilateral primary osteoarthritis, left knee: Secondary | ICD-10-CM | POA: Diagnosis not present

## 2014-06-09 DIAGNOSIS — M1711 Unilateral primary osteoarthritis, right knee: Secondary | ICD-10-CM | POA: Diagnosis not present

## 2014-06-17 DIAGNOSIS — N3000 Acute cystitis without hematuria: Secondary | ICD-10-CM | POA: Diagnosis not present

## 2014-06-17 DIAGNOSIS — R350 Frequency of micturition: Secondary | ICD-10-CM | POA: Diagnosis not present

## 2014-06-25 DIAGNOSIS — F321 Major depressive disorder, single episode, moderate: Secondary | ICD-10-CM | POA: Diagnosis not present

## 2014-07-01 DIAGNOSIS — R300 Dysuria: Secondary | ICD-10-CM | POA: Diagnosis not present

## 2014-07-06 ENCOUNTER — Encounter: Payer: BC Managed Care – PPO | Admitting: Endocrinology

## 2014-07-20 DIAGNOSIS — M25512 Pain in left shoulder: Secondary | ICD-10-CM | POA: Diagnosis not present

## 2014-07-23 DIAGNOSIS — M25171 Fistula, right ankle: Secondary | ICD-10-CM | POA: Diagnosis not present

## 2014-07-23 DIAGNOSIS — M25562 Pain in left knee: Secondary | ICD-10-CM | POA: Diagnosis not present

## 2014-07-23 DIAGNOSIS — M1712 Unilateral primary osteoarthritis, left knee: Secondary | ICD-10-CM | POA: Diagnosis not present

## 2014-07-23 DIAGNOSIS — M25561 Pain in right knee: Secondary | ICD-10-CM | POA: Diagnosis not present

## 2014-08-27 ENCOUNTER — Other Ambulatory Visit: Payer: Self-pay

## 2014-08-27 DIAGNOSIS — Z1231 Encounter for screening mammogram for malignant neoplasm of breast: Secondary | ICD-10-CM

## 2014-09-11 DIAGNOSIS — M1711 Unilateral primary osteoarthritis, right knee: Secondary | ICD-10-CM | POA: Diagnosis not present

## 2014-09-11 DIAGNOSIS — M25562 Pain in left knee: Secondary | ICD-10-CM | POA: Diagnosis not present

## 2014-09-11 DIAGNOSIS — M170 Bilateral primary osteoarthritis of knee: Secondary | ICD-10-CM | POA: Diagnosis not present

## 2014-09-11 DIAGNOSIS — M1712 Unilateral primary osteoarthritis, left knee: Secondary | ICD-10-CM | POA: Diagnosis not present

## 2014-09-18 DIAGNOSIS — M25562 Pain in left knee: Secondary | ICD-10-CM | POA: Diagnosis not present

## 2014-09-29 DIAGNOSIS — Z1389 Encounter for screening for other disorder: Secondary | ICD-10-CM | POA: Diagnosis not present

## 2014-09-29 DIAGNOSIS — I10 Essential (primary) hypertension: Secondary | ICD-10-CM | POA: Diagnosis not present

## 2014-09-29 DIAGNOSIS — E780 Pure hypercholesterolemia: Secondary | ICD-10-CM | POA: Diagnosis not present

## 2014-09-29 DIAGNOSIS — Z0000 Encounter for general adult medical examination without abnormal findings: Secondary | ICD-10-CM | POA: Diagnosis not present

## 2014-09-29 DIAGNOSIS — E039 Hypothyroidism, unspecified: Secondary | ICD-10-CM | POA: Diagnosis not present

## 2014-10-06 ENCOUNTER — Ambulatory Visit: Payer: Medicare Other

## 2014-10-08 DIAGNOSIS — H1013 Acute atopic conjunctivitis, bilateral: Secondary | ICD-10-CM | POA: Diagnosis not present

## 2014-10-09 DIAGNOSIS — M25562 Pain in left knee: Secondary | ICD-10-CM | POA: Diagnosis not present

## 2014-10-09 DIAGNOSIS — M1712 Unilateral primary osteoarthritis, left knee: Secondary | ICD-10-CM | POA: Diagnosis not present

## 2014-10-21 DIAGNOSIS — H1013 Acute atopic conjunctivitis, bilateral: Secondary | ICD-10-CM | POA: Diagnosis not present

## 2014-11-05 ENCOUNTER — Ambulatory Visit: Payer: Medicare Other

## 2014-11-09 DIAGNOSIS — H1013 Acute atopic conjunctivitis, bilateral: Secondary | ICD-10-CM | POA: Diagnosis not present

## 2014-12-07 DIAGNOSIS — M5416 Radiculopathy, lumbar region: Secondary | ICD-10-CM | POA: Diagnosis not present

## 2014-12-07 DIAGNOSIS — Z6830 Body mass index (BMI) 30.0-30.9, adult: Secondary | ICD-10-CM | POA: Diagnosis not present

## 2014-12-17 DIAGNOSIS — F321 Major depressive disorder, single episode, moderate: Secondary | ICD-10-CM | POA: Diagnosis not present

## 2014-12-31 DIAGNOSIS — M2242 Chondromalacia patellae, left knee: Secondary | ICD-10-CM | POA: Diagnosis not present

## 2014-12-31 DIAGNOSIS — M23342 Other meniscus derangements, anterior horn of lateral meniscus, left knee: Secondary | ICD-10-CM | POA: Diagnosis not present

## 2014-12-31 DIAGNOSIS — M659 Synovitis and tenosynovitis, unspecified: Secondary | ICD-10-CM | POA: Diagnosis not present

## 2014-12-31 DIAGNOSIS — Y999 Unspecified external cause status: Secondary | ICD-10-CM | POA: Diagnosis not present

## 2014-12-31 DIAGNOSIS — M67262 Synovial hypertrophy, not elsewhere classified, left lower leg: Secondary | ICD-10-CM | POA: Diagnosis not present

## 2014-12-31 DIAGNOSIS — S83272A Complex tear of lateral meniscus, current injury, left knee, initial encounter: Secondary | ICD-10-CM | POA: Diagnosis not present

## 2014-12-31 DIAGNOSIS — G8918 Other acute postprocedural pain: Secondary | ICD-10-CM | POA: Diagnosis not present

## 2014-12-31 DIAGNOSIS — M94262 Chondromalacia, left knee: Secondary | ICD-10-CM | POA: Diagnosis not present

## 2014-12-31 DIAGNOSIS — Y929 Unspecified place or not applicable: Secondary | ICD-10-CM | POA: Diagnosis not present

## 2015-05-21 ENCOUNTER — Ambulatory Visit: Payer: Medicare Other | Admitting: Podiatry

## 2015-08-17 ENCOUNTER — Other Ambulatory Visit: Payer: Self-pay | Admitting: Family Medicine

## 2015-08-17 DIAGNOSIS — Z1231 Encounter for screening mammogram for malignant neoplasm of breast: Secondary | ICD-10-CM

## 2015-08-18 DIAGNOSIS — H2513 Age-related nuclear cataract, bilateral: Secondary | ICD-10-CM | POA: Diagnosis not present

## 2015-09-01 ENCOUNTER — Ambulatory Visit: Payer: Medicare Other

## 2015-09-02 ENCOUNTER — Ambulatory Visit: Payer: Medicare Other | Admitting: Podiatry

## 2015-09-09 ENCOUNTER — Ambulatory Visit
Admission: RE | Admit: 2015-09-09 | Discharge: 2015-09-09 | Disposition: A | Discharge status: Home / Self Care | Payer: Medicare Other | Source: Ambulatory Visit | Attending: Family Medicine | Admitting: Family Medicine

## 2015-09-09 DIAGNOSIS — Z1231 Encounter for screening mammogram for malignant neoplasm of breast: Secondary | ICD-10-CM

## 2015-09-16 ENCOUNTER — Ambulatory Visit (INDEPENDENT_AMBULATORY_CARE_PROVIDER_SITE_OTHER): Payer: Medicare Other

## 2015-09-16 ENCOUNTER — Encounter: Payer: Self-pay | Admitting: Podiatry

## 2015-09-16 ENCOUNTER — Ambulatory Visit (INDEPENDENT_AMBULATORY_CARE_PROVIDER_SITE_OTHER): Payer: Medicare Other | Admitting: Podiatry

## 2015-09-16 VITALS — BP 116/73 | HR 71 | Resp 16 | Ht 68.0 in | Wt 195.0 lb

## 2015-09-16 DIAGNOSIS — L600 Ingrowing nail: Secondary | ICD-10-CM | POA: Diagnosis not present

## 2015-09-16 DIAGNOSIS — M79671 Pain in right foot: Secondary | ICD-10-CM

## 2015-09-16 NOTE — Progress Notes (Signed)
   Subjective:    Patient ID: [name], female    DOB: 1950-10-22, 65 y.o.   MRN: [id number]  HPI Chief Complaint  Patient presents with  . Nail Problem    Bilateral; great toes-lateral side; nail discoloration & thickened nails; check for ingrown toenails      Review of Systems  Musculoskeletal: Positive for back pain and gait problem.  All other systems reviewed and are negative.      Objective:   Physical Exam        Assessment & Plan:

## 2015-09-16 NOTE — Progress Notes (Signed)
Subjective:     Patient ID: [name], female   DOB: 1950-09-20, 65 y.o.   MRN: [id number]  HPI patient states that she's had chronic problems with her nails of the hallux bilateral with pain in the corners and inability to wear shoe gear comfortably   Review of Systems  All other systems reviewed and are negative.      Objective:   Physical Exam  Constitutional: She is oriented to person, place, and time.  Cardiovascular: Intact distal pulses.   Musculoskeletal: Normal range of motion.  Neurological: She is oriented to person, place, and time.  Skin: Skin is warm.  Nursing note and vitals reviewed.  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 Explained procedure and risk and patient wants this done but is going to the beach and we will do it when she returns

## 2015-09-16 NOTE — Patient Instructions (Signed)

## 2015-09-22 DIAGNOSIS — Z1389 Encounter for screening for other disorder: Secondary | ICD-10-CM | POA: Diagnosis not present

## 2015-09-22 DIAGNOSIS — E7800 Pure hypercholesterolemia, unspecified: Secondary | ICD-10-CM | POA: Diagnosis not present

## 2015-09-22 DIAGNOSIS — E039 Hypothyroidism, unspecified: Secondary | ICD-10-CM | POA: Diagnosis not present

## 2015-09-22 DIAGNOSIS — N3000 Acute cystitis without hematuria: Secondary | ICD-10-CM | POA: Diagnosis not present

## 2015-09-22 DIAGNOSIS — R350 Frequency of micturition: Secondary | ICD-10-CM | POA: Diagnosis not present

## 2015-09-22 DIAGNOSIS — I10 Essential (primary) hypertension: Secondary | ICD-10-CM | POA: Diagnosis not present

## 2015-11-01 DIAGNOSIS — M25562 Pain in left knee: Secondary | ICD-10-CM | POA: Diagnosis not present

## 2015-11-01 DIAGNOSIS — M1712 Unilateral primary osteoarthritis, left knee: Secondary | ICD-10-CM | POA: Diagnosis not present

## 2015-11-02 DIAGNOSIS — F321 Major depressive disorder, single episode, moderate: Secondary | ICD-10-CM | POA: Diagnosis not present

## 2015-11-11 ENCOUNTER — Other Ambulatory Visit: Payer: Self-pay | Admitting: Orthopedic Surgery

## 2015-11-11 DIAGNOSIS — M25562 Pain in left knee: Secondary | ICD-10-CM

## 2015-11-19 ENCOUNTER — Ambulatory Visit
Admission: RE | Admit: 2015-11-19 | Discharge: 2015-11-19 | Disposition: A | Discharge status: Home / Self Care | Payer: Medicare Other | Source: Ambulatory Visit | Attending: Orthopedic Surgery | Admitting: Orthopedic Surgery

## 2015-11-19 DIAGNOSIS — M25562 Pain in left knee: Secondary | ICD-10-CM

## 2015-11-19 DIAGNOSIS — M7989 Other specified soft tissue disorders: Secondary | ICD-10-CM | POA: Diagnosis not present

## 2015-11-30 DIAGNOSIS — F321 Major depressive disorder, single episode, moderate: Secondary | ICD-10-CM | POA: Diagnosis not present

## 2015-12-03 DIAGNOSIS — M9420 Chondromalacia, unspecified site: Secondary | ICD-10-CM | POA: Diagnosis not present

## 2015-12-03 DIAGNOSIS — M2392 Unspecified internal derangement of left knee: Secondary | ICD-10-CM | POA: Diagnosis not present

## 2015-12-03 DIAGNOSIS — M170 Bilateral primary osteoarthritis of knee: Secondary | ICD-10-CM | POA: Diagnosis not present

## 2015-12-03 DIAGNOSIS — M1711 Unilateral primary osteoarthritis, right knee: Secondary | ICD-10-CM | POA: Diagnosis not present

## 2015-12-03 DIAGNOSIS — M1712 Unilateral primary osteoarthritis, left knee: Secondary | ICD-10-CM | POA: Diagnosis not present

## 2016-01-12 DIAGNOSIS — M25561 Pain in right knee: Secondary | ICD-10-CM | POA: Diagnosis not present

## 2016-01-12 DIAGNOSIS — M170 Bilateral primary osteoarthritis of knee: Secondary | ICD-10-CM | POA: Diagnosis not present

## 2016-01-12 DIAGNOSIS — M25562 Pain in left knee: Secondary | ICD-10-CM | POA: Diagnosis not present

## 2016-01-17 DIAGNOSIS — M2041 Other hammer toe(s) (acquired), right foot: Secondary | ICD-10-CM | POA: Diagnosis not present

## 2016-01-17 DIAGNOSIS — M21611 Bunion of right foot: Secondary | ICD-10-CM | POA: Diagnosis not present

## 2016-01-17 DIAGNOSIS — M7741 Metatarsalgia, right foot: Secondary | ICD-10-CM | POA: Diagnosis not present

## 2016-01-20 DIAGNOSIS — M170 Bilateral primary osteoarthritis of knee: Secondary | ICD-10-CM | POA: Diagnosis not present

## 2016-01-20 DIAGNOSIS — M25562 Pain in left knee: Secondary | ICD-10-CM | POA: Diagnosis not present

## 2016-01-20 DIAGNOSIS — M1711 Unilateral primary osteoarthritis, right knee: Secondary | ICD-10-CM | POA: Diagnosis not present

## 2016-01-20 DIAGNOSIS — M1712 Unilateral primary osteoarthritis, left knee: Secondary | ICD-10-CM | POA: Diagnosis not present

## 2016-01-20 DIAGNOSIS — M25561 Pain in right knee: Secondary | ICD-10-CM | POA: Diagnosis not present

## 2016-01-27 DIAGNOSIS — M170 Bilateral primary osteoarthritis of knee: Secondary | ICD-10-CM | POA: Diagnosis not present

## 2016-02-01 DIAGNOSIS — G8918 Other acute postprocedural pain: Secondary | ICD-10-CM | POA: Diagnosis not present

## 2016-02-01 DIAGNOSIS — M21611 Bunion of right foot: Secondary | ICD-10-CM | POA: Diagnosis not present

## 2016-02-01 DIAGNOSIS — M7741 Metatarsalgia, right foot: Secondary | ICD-10-CM | POA: Diagnosis not present

## 2016-02-01 DIAGNOSIS — M2011 Hallux valgus (acquired), right foot: Secondary | ICD-10-CM | POA: Diagnosis not present

## 2016-02-01 IMAGING — MG MM SCREEN MAMMOGRAM BILATERAL
4 series · 4 of 4 positions shown · non-contrast
Comparison: Previous exam(s).

CLINICAL DATA: Screening.

EXAM:
DIGITAL SCREENING BILATERAL MAMMOGRAM WITH CAD

[R CC]
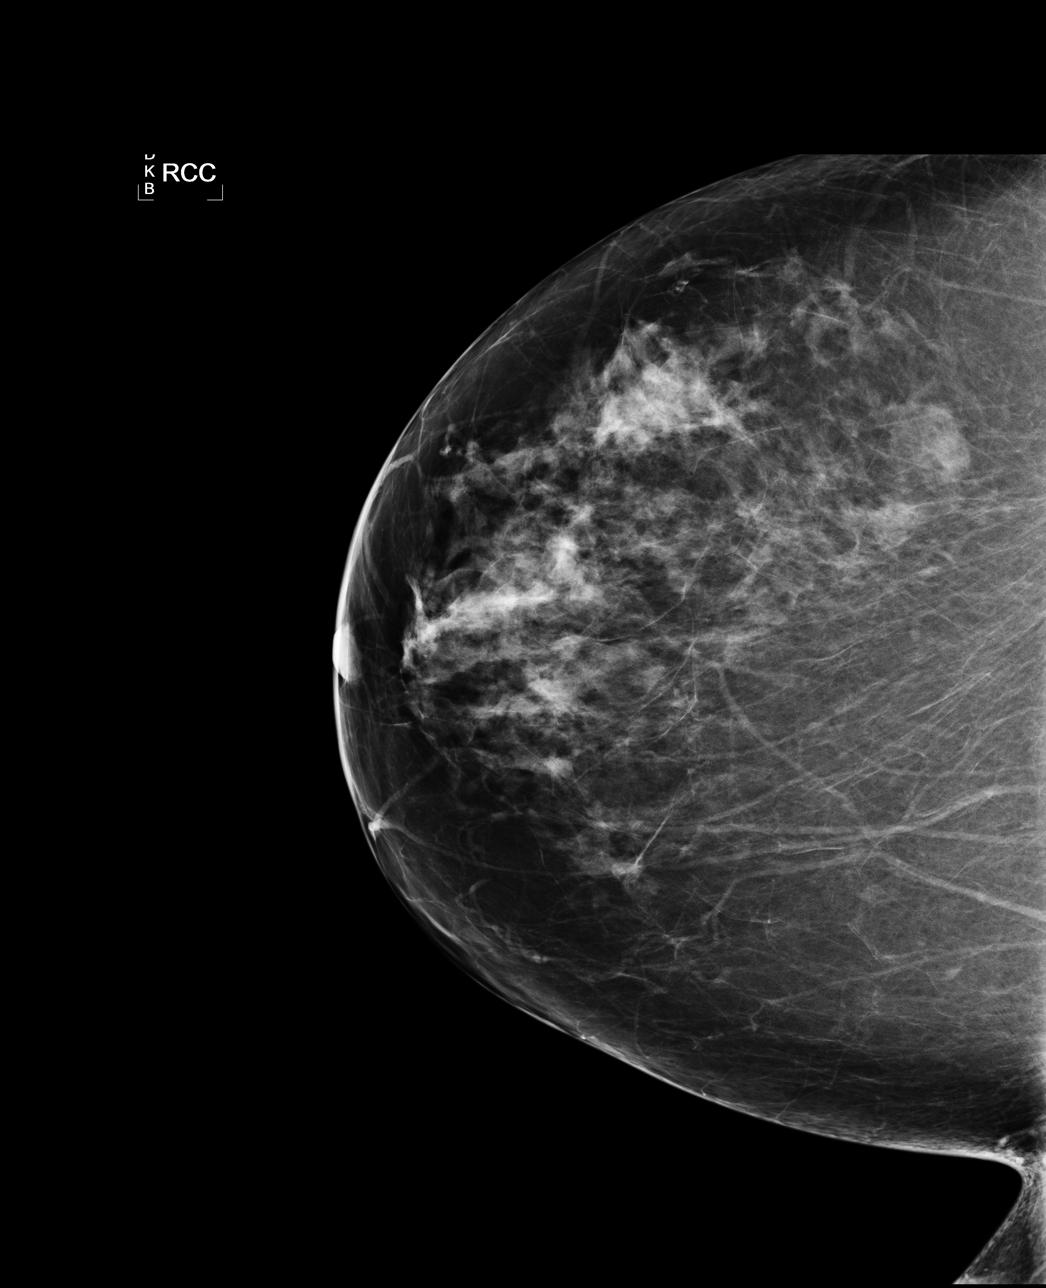

[L CC]
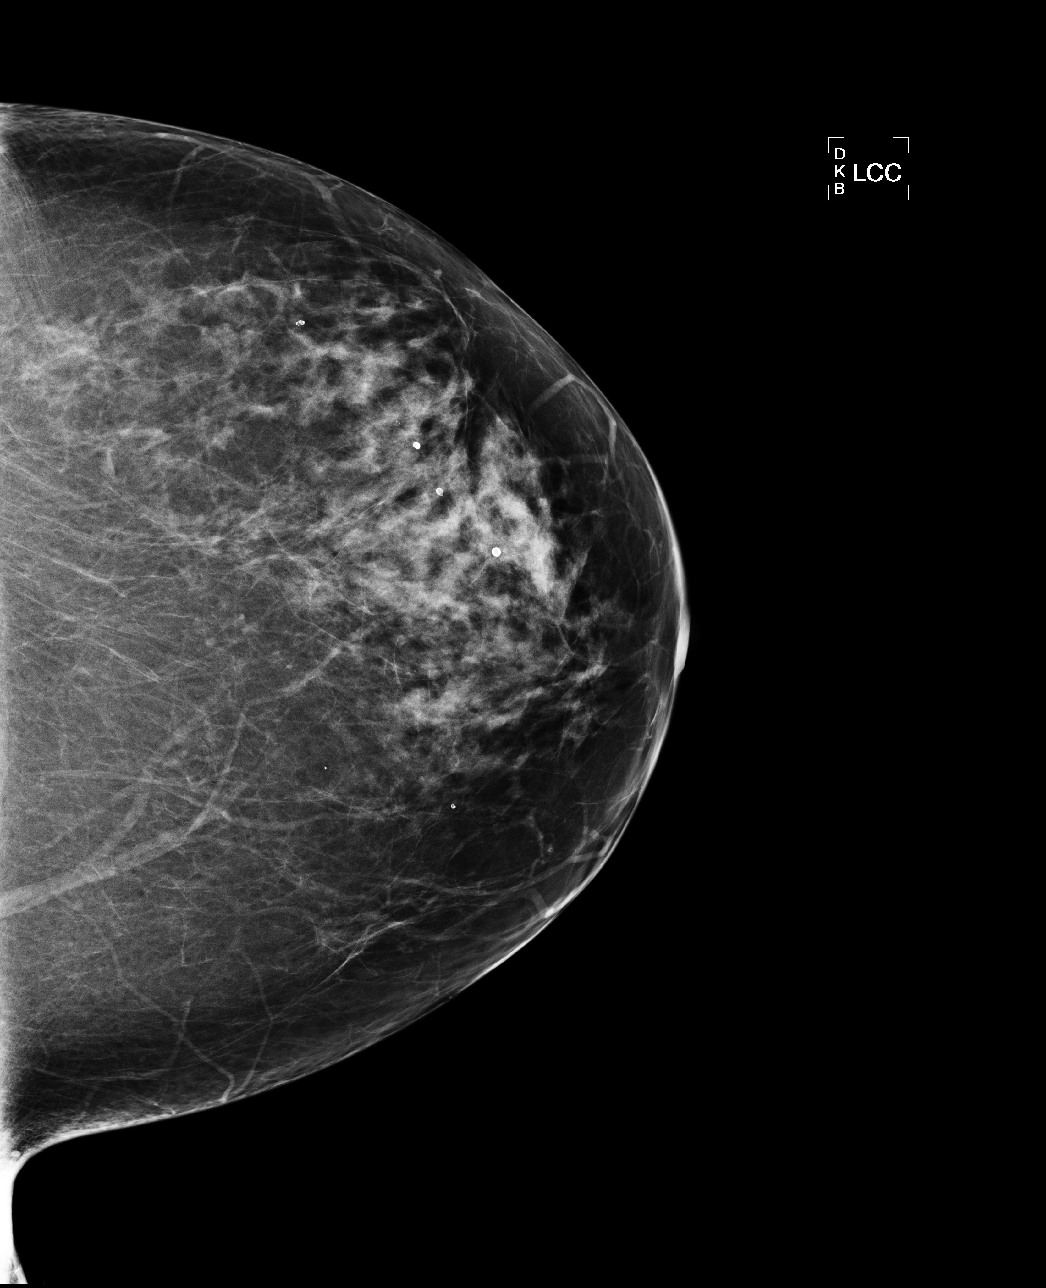

[L MLO]
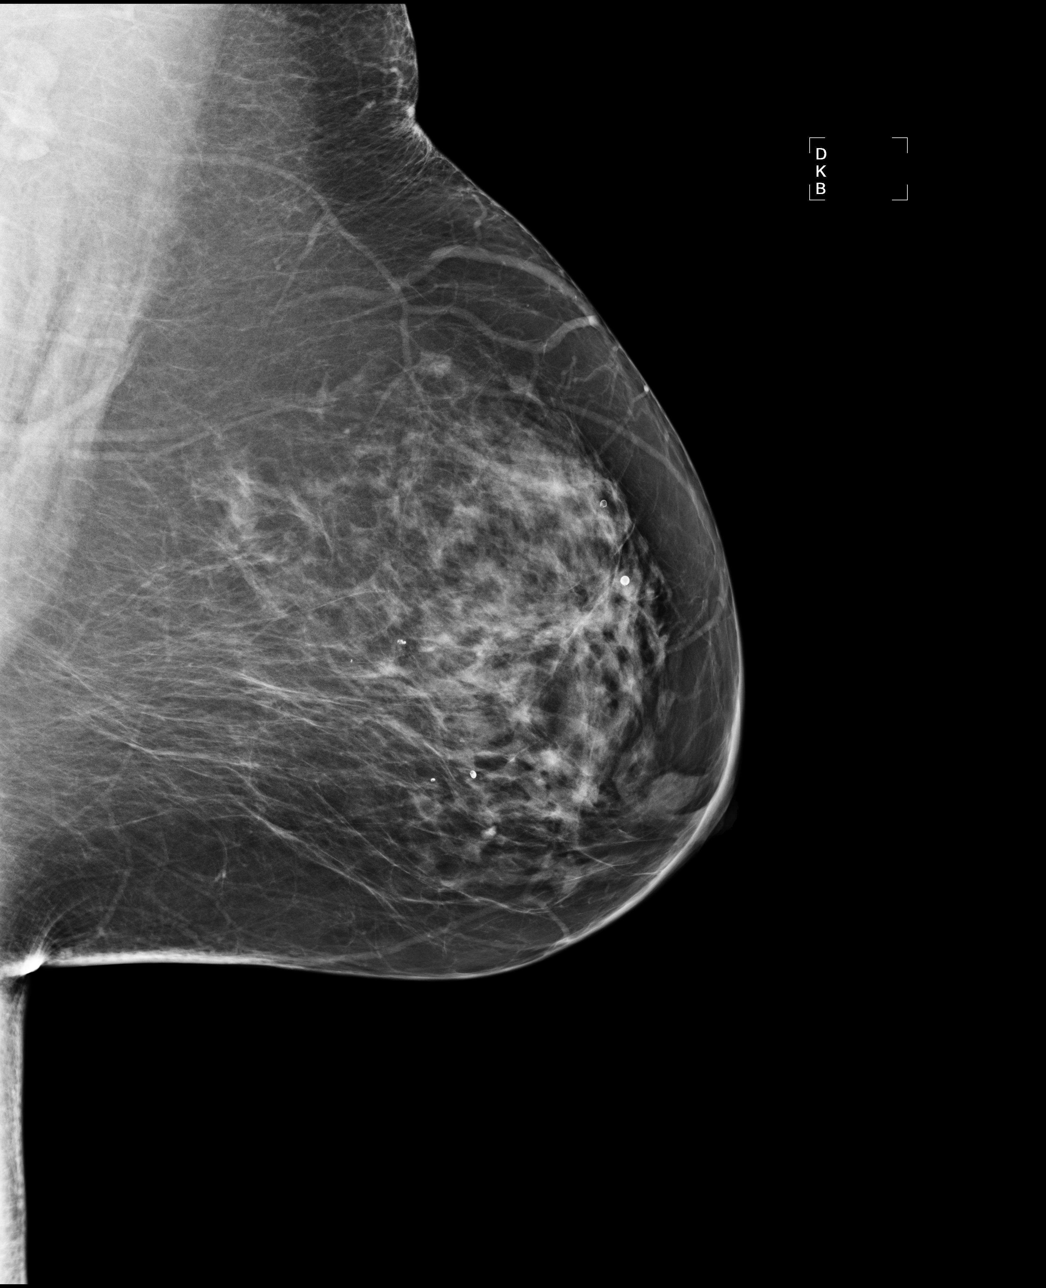

[R MLO]
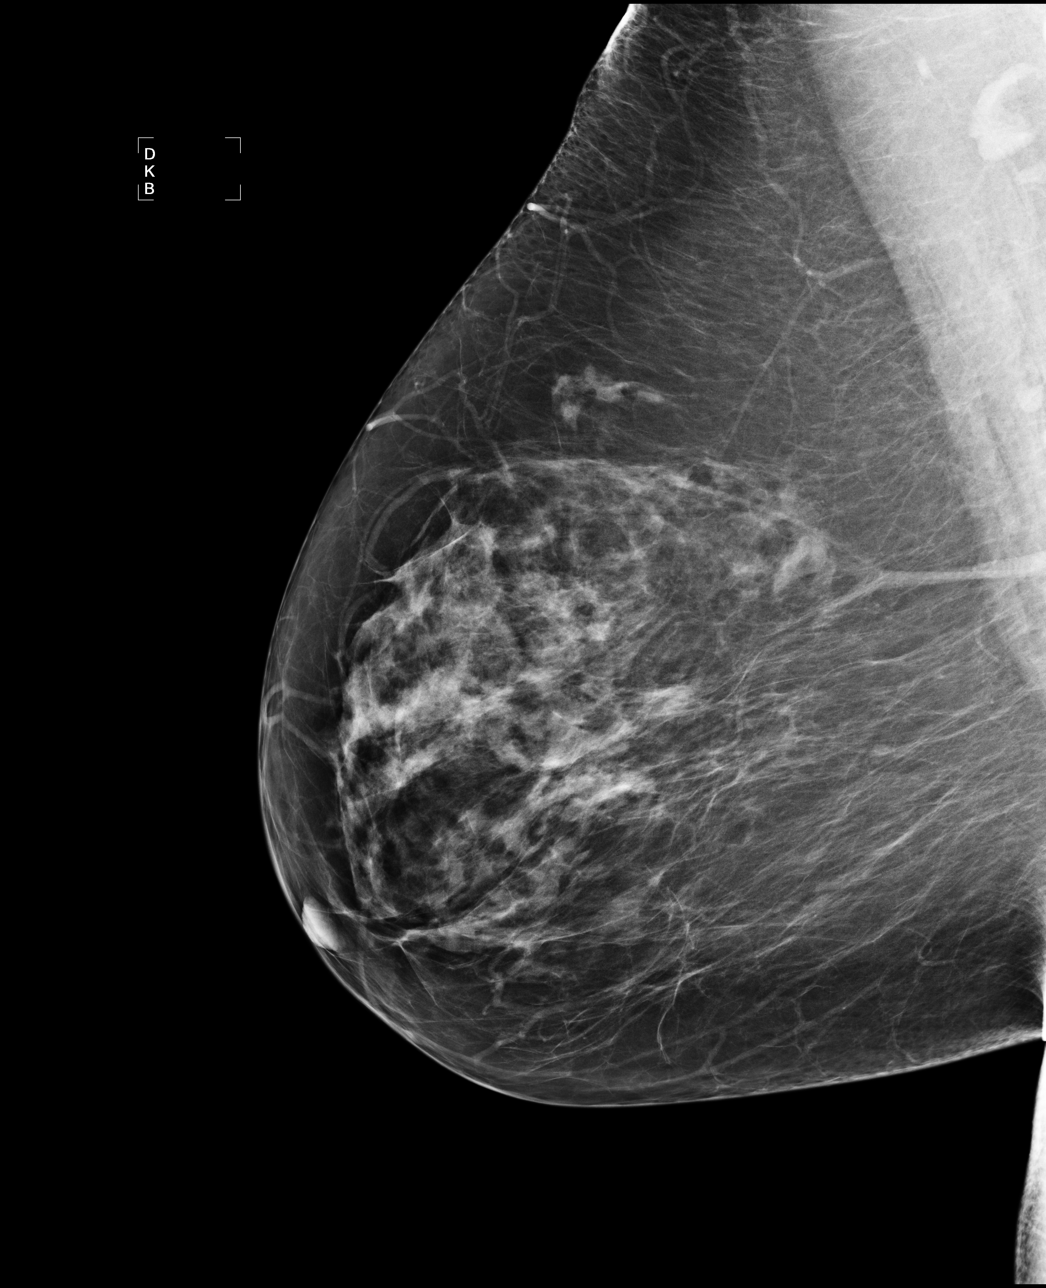

[4 of 4 positions shown; findings below may reference images not displayed]

ACR Breast Density Category b: There are scattered areas of
fibroglandular density.
FINDINGS: There are no findings suspicious for malignancy. Images were
processed with CAD.
IMPRESSION: No mammographic evidence of malignancy. A result letter of this
screening mammogram will be mailed directly to the patient.

RECOMMENDATION:
Screening mammogram in one year. (Code:AS-G-LCT)

BI-RADS CATEGORY  1: Negative.

## 2016-02-21 DIAGNOSIS — Z4789 Encounter for other orthopedic aftercare: Secondary | ICD-10-CM | POA: Diagnosis not present

## 2016-02-21 DIAGNOSIS — M21611 Bunion of right foot: Secondary | ICD-10-CM | POA: Diagnosis not present

## 2016-02-21 DIAGNOSIS — M7741 Metatarsalgia, right foot: Secondary | ICD-10-CM | POA: Diagnosis not present

## 2016-03-20 DIAGNOSIS — Z4789 Encounter for other orthopedic aftercare: Secondary | ICD-10-CM | POA: Diagnosis not present

## 2016-03-20 DIAGNOSIS — M21611 Bunion of right foot: Secondary | ICD-10-CM | POA: Diagnosis not present

## 2016-03-20 DIAGNOSIS — M7741 Metatarsalgia, right foot: Secondary | ICD-10-CM | POA: Diagnosis not present

## 2016-04-17 DIAGNOSIS — M21611 Bunion of right foot: Secondary | ICD-10-CM | POA: Diagnosis not present

## 2016-04-17 DIAGNOSIS — Z4789 Encounter for other orthopedic aftercare: Secondary | ICD-10-CM | POA: Diagnosis not present

## 2016-05-02 DIAGNOSIS — F17200 Nicotine dependence, unspecified, uncomplicated: Secondary | ICD-10-CM | POA: Diagnosis not present

## 2016-05-02 DIAGNOSIS — Z6829 Body mass index (BMI) 29.0-29.9, adult: Secondary | ICD-10-CM | POA: Diagnosis not present

## 2016-05-02 DIAGNOSIS — R490 Dysphonia: Secondary | ICD-10-CM | POA: Diagnosis not present

## 2016-05-02 DIAGNOSIS — I959 Hypotension, unspecified: Secondary | ICD-10-CM | POA: Diagnosis not present

## 2016-05-02 DIAGNOSIS — E7800 Pure hypercholesterolemia, unspecified: Secondary | ICD-10-CM | POA: Diagnosis not present

## 2016-05-02 DIAGNOSIS — E559 Vitamin D deficiency, unspecified: Secondary | ICD-10-CM | POA: Diagnosis not present

## 2016-05-02 DIAGNOSIS — Z1389 Encounter for screening for other disorder: Secondary | ICD-10-CM | POA: Diagnosis not present

## 2016-05-02 DIAGNOSIS — I10 Essential (primary) hypertension: Secondary | ICD-10-CM | POA: Diagnosis not present

## 2016-05-02 DIAGNOSIS — Z0000 Encounter for general adult medical examination without abnormal findings: Secondary | ICD-10-CM | POA: Diagnosis not present

## 2016-05-02 DIAGNOSIS — E039 Hypothyroidism, unspecified: Secondary | ICD-10-CM | POA: Diagnosis not present

## 2016-05-02 DIAGNOSIS — K52831 Collagenous colitis: Secondary | ICD-10-CM | POA: Diagnosis not present

## 2016-05-18 DIAGNOSIS — G43009 Migraine without aura, not intractable, without status migrainosus: Secondary | ICD-10-CM | POA: Diagnosis not present

## 2016-05-18 DIAGNOSIS — I10 Essential (primary) hypertension: Secondary | ICD-10-CM | POA: Diagnosis not present

## 2016-05-23 DIAGNOSIS — F321 Major depressive disorder, single episode, moderate: Secondary | ICD-10-CM | POA: Diagnosis not present

## 2016-06-29 DIAGNOSIS — I10 Essential (primary) hypertension: Secondary | ICD-10-CM | POA: Diagnosis not present

## 2016-06-29 DIAGNOSIS — R000 Tachycardia, unspecified: Secondary | ICD-10-CM | POA: Diagnosis not present

## 2016-07-05 ENCOUNTER — Ambulatory Visit (INDEPENDENT_AMBULATORY_CARE_PROVIDER_SITE_OTHER): Payer: Medicare Other

## 2016-07-05 DIAGNOSIS — R002 Palpitations: Secondary | ICD-10-CM | POA: Diagnosis not present

## 2016-07-24 DIAGNOSIS — H2513 Age-related nuclear cataract, bilateral: Secondary | ICD-10-CM | POA: Diagnosis not present

## 2016-08-15 ENCOUNTER — Other Ambulatory Visit (HOSPITAL_COMMUNITY): Payer: Self-pay | Admitting: Gastroenterology

## 2016-08-15 DIAGNOSIS — R110 Nausea: Secondary | ICD-10-CM | POA: Diagnosis not present

## 2016-08-15 DIAGNOSIS — Z800 Family history of malignant neoplasm of digestive organs: Secondary | ICD-10-CM | POA: Diagnosis not present

## 2016-08-18 DIAGNOSIS — I10 Essential (primary) hypertension: Secondary | ICD-10-CM | POA: Diagnosis not present

## 2016-08-23 ENCOUNTER — Encounter (HOSPITAL_COMMUNITY): Payer: Medicare Other

## 2016-09-28 ENCOUNTER — Other Ambulatory Visit: Payer: Self-pay | Admitting: Family Medicine

## 2016-09-28 DIAGNOSIS — Z1231 Encounter for screening mammogram for malignant neoplasm of breast: Secondary | ICD-10-CM

## 2016-10-06 ENCOUNTER — Ambulatory Visit
Admission: RE | Admit: 2016-10-06 | Discharge: 2016-10-06 | Disposition: A | Discharge status: Home / Self Care | Payer: Medicare Other | Source: Ambulatory Visit | Attending: Family Medicine | Admitting: Family Medicine

## 2016-10-06 DIAGNOSIS — Z1231 Encounter for screening mammogram for malignant neoplasm of breast: Secondary | ICD-10-CM

## 2016-10-06 DIAGNOSIS — R399 Unspecified symptoms and signs involving the genitourinary system: Secondary | ICD-10-CM | POA: Diagnosis not present

## 2016-10-13 DIAGNOSIS — M25551 Pain in right hip: Secondary | ICD-10-CM | POA: Diagnosis not present

## 2016-10-13 DIAGNOSIS — M7061 Trochanteric bursitis, right hip: Secondary | ICD-10-CM | POA: Diagnosis not present

## 2016-11-13 DIAGNOSIS — F321 Major depressive disorder, single episode, moderate: Secondary | ICD-10-CM | POA: Diagnosis not present

## 2016-11-28 ENCOUNTER — Other Ambulatory Visit: Payer: Self-pay | Admitting: Student

## 2016-11-28 DIAGNOSIS — M5416 Radiculopathy, lumbar region: Secondary | ICD-10-CM | POA: Diagnosis not present

## 2016-12-04 ENCOUNTER — Ambulatory Visit
Admission: RE | Admit: 2016-12-04 | Discharge: 2016-12-04 | Disposition: A | Discharge status: Home / Self Care | Payer: Medicare Other | Source: Ambulatory Visit | Attending: Student | Admitting: Student

## 2016-12-04 DIAGNOSIS — M5126 Other intervertebral disc displacement, lumbar region: Secondary | ICD-10-CM | POA: Diagnosis not present

## 2016-12-04 DIAGNOSIS — M5416 Radiculopathy, lumbar region: Secondary | ICD-10-CM

## 2016-12-04 MED ORDER — GADOBENATE DIMEGLUMINE 529 MG/ML IV SOLN
17.0000 mL | Freq: Once | INTRAVENOUS | Status: AC | PRN
  Administered 2016-12-04: 17 mL via INTRAVENOUS

## 2016-12-05 DIAGNOSIS — M5416 Radiculopathy, lumbar region: Secondary | ICD-10-CM | POA: Diagnosis not present

## 2016-12-07 ENCOUNTER — Other Ambulatory Visit: Payer: Self-pay | Admitting: Student

## 2016-12-07 DIAGNOSIS — M5416 Radiculopathy, lumbar region: Secondary | ICD-10-CM

## 2016-12-18 ENCOUNTER — Ambulatory Visit
Admission: RE | Admit: 2016-12-18 | Discharge: 2016-12-18 | Disposition: A | Discharge status: Home / Self Care | Payer: Medicare Other | Source: Ambulatory Visit | Attending: Student | Admitting: Student

## 2016-12-18 DIAGNOSIS — M5416 Radiculopathy, lumbar region: Secondary | ICD-10-CM

## 2016-12-18 DIAGNOSIS — M47817 Spondylosis without myelopathy or radiculopathy, lumbosacral region: Secondary | ICD-10-CM | POA: Diagnosis not present

## 2016-12-18 MED ORDER — IOPAMIDOL (ISOVUE-M 200) INJECTION 41%
1.0000 mL | Freq: Once | INTRAMUSCULAR | Status: AC
  Administered 2016-12-18: 1 mL via EPIDURAL

## 2016-12-18 MED ORDER — METHYLPREDNISOLONE ACETATE 40 MG/ML INJ SUSP (RADIOLOG
120.0000 mg | Freq: Once | INTRAMUSCULAR | Status: AC
  Administered 2016-12-18: 120 mg via EPIDURAL

## 2016-12-18 NOTE — Discharge Instructions (Signed)

## 2017-01-03 DIAGNOSIS — M5416 Radiculopathy, lumbar region: Secondary | ICD-10-CM | POA: Diagnosis not present

## 2017-02-06 ENCOUNTER — Other Ambulatory Visit: Payer: Self-pay | Admitting: Student

## 2017-02-06 DIAGNOSIS — M5416 Radiculopathy, lumbar region: Secondary | ICD-10-CM

## 2017-02-09 ENCOUNTER — Other Ambulatory Visit: Payer: Self-pay | Admitting: Student

## 2017-02-09 ENCOUNTER — Ambulatory Visit
Admission: RE | Admit: 2017-02-09 | Discharge: 2017-02-09 | Disposition: A | Discharge status: Home / Self Care | Payer: Medicare Other | Source: Ambulatory Visit | Attending: Student | Admitting: Student

## 2017-02-09 DIAGNOSIS — M5416 Radiculopathy, lumbar region: Secondary | ICD-10-CM

## 2017-02-09 DIAGNOSIS — M545 Low back pain: Secondary | ICD-10-CM | POA: Diagnosis not present

## 2017-02-09 MED ORDER — METHYLPREDNISOLONE ACETATE 40 MG/ML INJ SUSP (RADIOLOG
120.0000 mg | Freq: Once | INTRAMUSCULAR | Status: AC
  Administered 2017-02-09: 120 mg via EPIDURAL

## 2017-02-09 MED ORDER — IOPAMIDOL (ISOVUE-M 200) INJECTION 41%
1.0000 mL | Freq: Once | INTRAMUSCULAR | Status: AC
  Administered 2017-02-09: 1 mL via EPIDURAL

## 2017-02-09 NOTE — Discharge Instructions (Signed)

## 2017-04-02 ENCOUNTER — Other Ambulatory Visit: Payer: Self-pay | Admitting: Neurological Surgery

## 2017-04-02 DIAGNOSIS — M5416 Radiculopathy, lumbar region: Secondary | ICD-10-CM

## 2017-04-13 ENCOUNTER — Ambulatory Visit
Admission: RE | Admit: 2017-04-13 | Discharge: 2017-04-13 | Disposition: A | Discharge status: Home / Self Care | Payer: Medicare Other | Source: Ambulatory Visit | Attending: Neurological Surgery | Admitting: Neurological Surgery

## 2017-04-13 DIAGNOSIS — M5416 Radiculopathy, lumbar region: Secondary | ICD-10-CM

## 2017-04-13 DIAGNOSIS — M47817 Spondylosis without myelopathy or radiculopathy, lumbosacral region: Secondary | ICD-10-CM | POA: Diagnosis not present

## 2017-04-13 DIAGNOSIS — M545 Low back pain: Secondary | ICD-10-CM | POA: Diagnosis not present

## 2017-04-13 MED ORDER — METHYLPREDNISOLONE ACETATE 40 MG/ML INJ SUSP (RADIOLOG
120.0000 mg | Freq: Once | INTRAMUSCULAR | Status: AC
  Administered 2017-04-13: 120 mg via EPIDURAL

## 2017-04-13 MED ORDER — IOPAMIDOL (ISOVUE-M 200) INJECTION 41%
1.0000 mL | Freq: Once | INTRAMUSCULAR | Status: AC
  Administered 2017-04-13: 1 mL via EPIDURAL

## 2017-04-13 NOTE — Discharge Instructions (Signed)

## 2017-04-25 DIAGNOSIS — M961 Postlaminectomy syndrome, not elsewhere classified: Secondary | ICD-10-CM | POA: Diagnosis not present

## 2017-04-25 DIAGNOSIS — M5416 Radiculopathy, lumbar region: Secondary | ICD-10-CM | POA: Diagnosis not present

## 2017-06-18 DIAGNOSIS — F419 Anxiety disorder, unspecified: Secondary | ICD-10-CM | POA: Diagnosis not present

## 2017-06-18 DIAGNOSIS — E559 Vitamin D deficiency, unspecified: Secondary | ICD-10-CM | POA: Diagnosis not present

## 2017-06-18 DIAGNOSIS — H6982 Other specified disorders of Eustachian tube, left ear: Secondary | ICD-10-CM | POA: Diagnosis not present

## 2017-06-18 DIAGNOSIS — E039 Hypothyroidism, unspecified: Secondary | ICD-10-CM | POA: Diagnosis not present

## 2017-06-18 DIAGNOSIS — T162XXA Foreign body in left ear, initial encounter: Secondary | ICD-10-CM | POA: Diagnosis not present

## 2017-06-18 DIAGNOSIS — M545 Low back pain: Secondary | ICD-10-CM | POA: Diagnosis not present

## 2017-06-18 DIAGNOSIS — E7800 Pure hypercholesterolemia, unspecified: Secondary | ICD-10-CM | POA: Diagnosis not present

## 2017-06-18 DIAGNOSIS — F17200 Nicotine dependence, unspecified, uncomplicated: Secondary | ICD-10-CM | POA: Diagnosis not present

## 2017-06-18 DIAGNOSIS — I10 Essential (primary) hypertension: Secondary | ICD-10-CM | POA: Diagnosis not present

## 2017-06-22 DIAGNOSIS — I10 Essential (primary) hypertension: Secondary | ICD-10-CM | POA: Diagnosis not present

## 2017-06-22 DIAGNOSIS — E7800 Pure hypercholesterolemia, unspecified: Secondary | ICD-10-CM | POA: Diagnosis not present

## 2017-06-22 DIAGNOSIS — E039 Hypothyroidism, unspecified: Secondary | ICD-10-CM | POA: Diagnosis not present

## 2017-06-22 DIAGNOSIS — E559 Vitamin D deficiency, unspecified: Secondary | ICD-10-CM | POA: Diagnosis not present

## 2017-07-25 DIAGNOSIS — M5416 Radiculopathy, lumbar region: Secondary | ICD-10-CM | POA: Diagnosis not present

## 2017-07-25 DIAGNOSIS — M961 Postlaminectomy syndrome, not elsewhere classified: Secondary | ICD-10-CM | POA: Diagnosis not present

## 2017-07-31 ENCOUNTER — Other Ambulatory Visit: Payer: Self-pay | Admitting: Anesthesiology

## 2017-07-31 DIAGNOSIS — M5416 Radiculopathy, lumbar region: Secondary | ICD-10-CM

## 2017-08-03 ENCOUNTER — Other Ambulatory Visit: Payer: Medicare Other

## 2017-08-07 ENCOUNTER — Ambulatory Visit
Admission: RE | Admit: 2017-08-07 | Discharge: 2017-08-07 | Disposition: A | Discharge status: Home / Self Care | Payer: Medicare Other | Source: Ambulatory Visit | Attending: Anesthesiology | Admitting: Anesthesiology

## 2017-08-07 DIAGNOSIS — M5416 Radiculopathy, lumbar region: Secondary | ICD-10-CM

## 2017-08-23 DIAGNOSIS — Z6829 Body mass index (BMI) 29.0-29.9, adult: Secondary | ICD-10-CM | POA: Diagnosis not present

## 2017-08-23 DIAGNOSIS — M5416 Radiculopathy, lumbar region: Secondary | ICD-10-CM | POA: Diagnosis not present

## 2017-08-23 DIAGNOSIS — I10 Essential (primary) hypertension: Secondary | ICD-10-CM | POA: Diagnosis not present

## 2017-08-23 DIAGNOSIS — M961 Postlaminectomy syndrome, not elsewhere classified: Secondary | ICD-10-CM | POA: Diagnosis not present

## 2017-11-15 DIAGNOSIS — Z6830 Body mass index (BMI) 30.0-30.9, adult: Secondary | ICD-10-CM | POA: Diagnosis not present

## 2017-11-15 DIAGNOSIS — I10 Essential (primary) hypertension: Secondary | ICD-10-CM | POA: Diagnosis not present

## 2017-11-15 DIAGNOSIS — M5416 Radiculopathy, lumbar region: Secondary | ICD-10-CM | POA: Diagnosis not present

## 2017-11-15 DIAGNOSIS — M961 Postlaminectomy syndrome, not elsewhere classified: Secondary | ICD-10-CM | POA: Diagnosis not present

## 2017-11-20 ENCOUNTER — Encounter (INDEPENDENT_AMBULATORY_CARE_PROVIDER_SITE_OTHER): Payer: Medicare Other | Admitting: Ophthalmology

## 2017-11-20 DIAGNOSIS — H35033 Hypertensive retinopathy, bilateral: Secondary | ICD-10-CM | POA: Diagnosis not present

## 2017-11-20 DIAGNOSIS — I10 Essential (primary) hypertension: Secondary | ICD-10-CM | POA: Diagnosis not present

## 2017-11-20 DIAGNOSIS — H4311 Vitreous hemorrhage, right eye: Secondary | ICD-10-CM

## 2017-11-20 DIAGNOSIS — H33311 Horseshoe tear of retina without detachment, right eye: Secondary | ICD-10-CM | POA: Diagnosis not present

## 2017-11-20 DIAGNOSIS — H33021 Retinal detachment with multiple breaks, right eye: Secondary | ICD-10-CM | POA: Diagnosis not present

## 2017-11-20 DIAGNOSIS — H43813 Vitreous degeneration, bilateral: Secondary | ICD-10-CM | POA: Diagnosis not present

## 2017-11-20 DIAGNOSIS — H2513 Age-related nuclear cataract, bilateral: Secondary | ICD-10-CM

## 2017-12-03 ENCOUNTER — Encounter (INDEPENDENT_AMBULATORY_CARE_PROVIDER_SITE_OTHER): Payer: Medicare Other | Admitting: Ophthalmology

## 2017-12-03 DIAGNOSIS — H33301 Unspecified retinal break, right eye: Secondary | ICD-10-CM | POA: Diagnosis not present

## 2018-01-07 ENCOUNTER — Encounter (INDEPENDENT_AMBULATORY_CARE_PROVIDER_SITE_OTHER): Payer: Medicare Other | Admitting: Ophthalmology

## 2018-01-16 ENCOUNTER — Encounter (INDEPENDENT_AMBULATORY_CARE_PROVIDER_SITE_OTHER): Payer: Medicare Other | Admitting: Ophthalmology

## 2018-01-16 DIAGNOSIS — H33021 Retinal detachment with multiple breaks, right eye: Secondary | ICD-10-CM

## 2018-01-24 ENCOUNTER — Other Ambulatory Visit: Payer: Self-pay | Admitting: Family Medicine

## 2018-01-24 DIAGNOSIS — Z1231 Encounter for screening mammogram for malignant neoplasm of breast: Secondary | ICD-10-CM

## 2018-01-25 DIAGNOSIS — H1013 Acute atopic conjunctivitis, bilateral: Secondary | ICD-10-CM | POA: Diagnosis not present

## 2018-01-31 DIAGNOSIS — M5416 Radiculopathy, lumbar region: Secondary | ICD-10-CM | POA: Diagnosis not present

## 2018-01-31 DIAGNOSIS — Z6830 Body mass index (BMI) 30.0-30.9, adult: Secondary | ICD-10-CM | POA: Diagnosis not present

## 2018-01-31 DIAGNOSIS — I10 Essential (primary) hypertension: Secondary | ICD-10-CM | POA: Diagnosis not present

## 2018-01-31 DIAGNOSIS — M961 Postlaminectomy syndrome, not elsewhere classified: Secondary | ICD-10-CM | POA: Diagnosis not present

## 2018-02-21 DIAGNOSIS — M545 Low back pain: Secondary | ICD-10-CM | POA: Diagnosis not present

## 2018-02-21 DIAGNOSIS — I10 Essential (primary) hypertension: Secondary | ICD-10-CM | POA: Diagnosis not present

## 2018-02-21 DIAGNOSIS — E7800 Pure hypercholesterolemia, unspecified: Secondary | ICD-10-CM | POA: Diagnosis not present

## 2018-02-21 DIAGNOSIS — E039 Hypothyroidism, unspecified: Secondary | ICD-10-CM | POA: Diagnosis not present

## 2018-02-21 DIAGNOSIS — F17200 Nicotine dependence, unspecified, uncomplicated: Secondary | ICD-10-CM | POA: Diagnosis not present

## 2018-02-21 DIAGNOSIS — E559 Vitamin D deficiency, unspecified: Secondary | ICD-10-CM | POA: Diagnosis not present

## 2018-02-21 DIAGNOSIS — R05 Cough: Secondary | ICD-10-CM | POA: Diagnosis not present

## 2018-02-21 DIAGNOSIS — F419 Anxiety disorder, unspecified: Secondary | ICD-10-CM | POA: Diagnosis not present

## 2018-02-26 ENCOUNTER — Ambulatory Visit: Payer: Medicare Other

## 2018-03-05 ENCOUNTER — Ambulatory Visit: Payer: Medicare Other

## 2018-03-05 DIAGNOSIS — K52831 Collagenous colitis: Secondary | ICD-10-CM | POA: Diagnosis not present

## 2018-03-05 DIAGNOSIS — B373 Candidiasis of vulva and vagina: Secondary | ICD-10-CM | POA: Diagnosis not present

## 2018-03-05 DIAGNOSIS — L03317 Cellulitis of buttock: Secondary | ICD-10-CM | POA: Diagnosis not present

## 2018-03-05 DIAGNOSIS — L0231 Cutaneous abscess of buttock: Secondary | ICD-10-CM | POA: Diagnosis not present

## 2018-03-26 ENCOUNTER — Ambulatory Visit
Admission: RE | Admit: 2018-03-26 | Discharge: 2018-03-26 | Disposition: A | Discharge status: Home / Self Care | Payer: Medicare Other | Source: Ambulatory Visit | Attending: Family Medicine | Admitting: Family Medicine

## 2018-03-26 DIAGNOSIS — Z1231 Encounter for screening mammogram for malignant neoplasm of breast: Secondary | ICD-10-CM | POA: Diagnosis not present

## 2018-04-10 ENCOUNTER — Encounter (INDEPENDENT_AMBULATORY_CARE_PROVIDER_SITE_OTHER): Payer: Medicare Other | Admitting: Ophthalmology

## 2018-04-10 ENCOUNTER — Other Ambulatory Visit: Payer: Self-pay

## 2018-04-10 DIAGNOSIS — H35033 Hypertensive retinopathy, bilateral: Secondary | ICD-10-CM | POA: Diagnosis not present

## 2018-04-10 DIAGNOSIS — H43813 Vitreous degeneration, bilateral: Secondary | ICD-10-CM

## 2018-04-10 DIAGNOSIS — H33301 Unspecified retinal break, right eye: Secondary | ICD-10-CM

## 2018-04-10 DIAGNOSIS — I10 Essential (primary) hypertension: Secondary | ICD-10-CM

## 2018-04-23 DIAGNOSIS — M5416 Radiculopathy, lumbar region: Secondary | ICD-10-CM | POA: Diagnosis not present

## 2018-04-23 DIAGNOSIS — M545 Low back pain: Secondary | ICD-10-CM | POA: Diagnosis not present

## 2018-06-10 DIAGNOSIS — M961 Postlaminectomy syndrome, not elsewhere classified: Secondary | ICD-10-CM | POA: Diagnosis not present

## 2018-06-10 DIAGNOSIS — M545 Low back pain: Secondary | ICD-10-CM | POA: Diagnosis not present

## 2018-08-19 DIAGNOSIS — E039 Hypothyroidism, unspecified: Secondary | ICD-10-CM | POA: Diagnosis not present

## 2018-08-19 DIAGNOSIS — M545 Low back pain: Secondary | ICD-10-CM | POA: Diagnosis not present

## 2018-08-19 DIAGNOSIS — I10 Essential (primary) hypertension: Secondary | ICD-10-CM | POA: Diagnosis not present

## 2018-08-19 DIAGNOSIS — R300 Dysuria: Secondary | ICD-10-CM | POA: Diagnosis not present

## 2018-09-12 DIAGNOSIS — Z6829 Body mass index (BMI) 29.0-29.9, adult: Secondary | ICD-10-CM | POA: Diagnosis not present

## 2018-09-12 DIAGNOSIS — M961 Postlaminectomy syndrome, not elsewhere classified: Secondary | ICD-10-CM | POA: Diagnosis not present

## 2018-09-12 DIAGNOSIS — M5416 Radiculopathy, lumbar region: Secondary | ICD-10-CM | POA: Diagnosis not present

## 2018-09-12 DIAGNOSIS — Q761 Klippel-Feil syndrome: Secondary | ICD-10-CM | POA: Diagnosis not present

## 2018-09-12 DIAGNOSIS — I10 Essential (primary) hypertension: Secondary | ICD-10-CM | POA: Diagnosis not present

## 2018-10-14 DIAGNOSIS — R6889 Other general symptoms and signs: Secondary | ICD-10-CM | POA: Diagnosis not present

## 2018-12-10 DIAGNOSIS — Q761 Klippel-Feil syndrome: Secondary | ICD-10-CM | POA: Diagnosis not present

## 2018-12-10 DIAGNOSIS — M961 Postlaminectomy syndrome, not elsewhere classified: Secondary | ICD-10-CM | POA: Diagnosis not present

## 2018-12-10 DIAGNOSIS — Z6829 Body mass index (BMI) 29.0-29.9, adult: Secondary | ICD-10-CM | POA: Diagnosis not present

## 2018-12-10 DIAGNOSIS — I10 Essential (primary) hypertension: Secondary | ICD-10-CM | POA: Diagnosis not present

## 2018-12-10 DIAGNOSIS — Z79891 Long term (current) use of opiate analgesic: Secondary | ICD-10-CM | POA: Diagnosis not present

## 2018-12-30 DIAGNOSIS — E039 Hypothyroidism, unspecified: Secondary | ICD-10-CM | POA: Diagnosis not present

## 2018-12-30 DIAGNOSIS — I10 Essential (primary) hypertension: Secondary | ICD-10-CM | POA: Diagnosis not present

## 2018-12-30 DIAGNOSIS — E7800 Pure hypercholesterolemia, unspecified: Secondary | ICD-10-CM | POA: Diagnosis not present

## 2019-02-04 ENCOUNTER — Encounter (INDEPENDENT_AMBULATORY_CARE_PROVIDER_SITE_OTHER): Payer: Medicare Other | Admitting: Ophthalmology

## 2019-02-04 DIAGNOSIS — H35033 Hypertensive retinopathy, bilateral: Secondary | ICD-10-CM

## 2019-02-04 DIAGNOSIS — H33301 Unspecified retinal break, right eye: Secondary | ICD-10-CM

## 2019-02-04 DIAGNOSIS — H43813 Vitreous degeneration, bilateral: Secondary | ICD-10-CM

## 2019-02-04 DIAGNOSIS — H35371 Puckering of macula, right eye: Secondary | ICD-10-CM | POA: Diagnosis not present

## 2019-02-04 DIAGNOSIS — I10 Essential (primary) hypertension: Secondary | ICD-10-CM | POA: Diagnosis not present

## 2019-02-13 DIAGNOSIS — E559 Vitamin D deficiency, unspecified: Secondary | ICD-10-CM | POA: Diagnosis not present

## 2019-02-13 DIAGNOSIS — Z0000 Encounter for general adult medical examination without abnormal findings: Secondary | ICD-10-CM | POA: Diagnosis not present

## 2019-02-13 DIAGNOSIS — E7800 Pure hypercholesterolemia, unspecified: Secondary | ICD-10-CM | POA: Diagnosis not present

## 2019-02-13 DIAGNOSIS — F17200 Nicotine dependence, unspecified, uncomplicated: Secondary | ICD-10-CM | POA: Diagnosis not present

## 2019-02-13 DIAGNOSIS — Z1159 Encounter for screening for other viral diseases: Secondary | ICD-10-CM | POA: Diagnosis not present

## 2019-02-13 DIAGNOSIS — M545 Low back pain: Secondary | ICD-10-CM | POA: Diagnosis not present

## 2019-02-13 DIAGNOSIS — F419 Anxiety disorder, unspecified: Secondary | ICD-10-CM | POA: Diagnosis not present

## 2019-02-13 DIAGNOSIS — Z1389 Encounter for screening for other disorder: Secondary | ICD-10-CM | POA: Diagnosis not present

## 2019-02-13 DIAGNOSIS — M8580 Other specified disorders of bone density and structure, unspecified site: Secondary | ICD-10-CM | POA: Diagnosis not present

## 2019-02-13 DIAGNOSIS — E2839 Other primary ovarian failure: Secondary | ICD-10-CM | POA: Diagnosis not present

## 2019-02-13 DIAGNOSIS — I10 Essential (primary) hypertension: Secondary | ICD-10-CM | POA: Diagnosis not present

## 2019-02-13 DIAGNOSIS — E039 Hypothyroidism, unspecified: Secondary | ICD-10-CM | POA: Diagnosis not present

## 2019-02-21 DIAGNOSIS — R509 Fever, unspecified: Secondary | ICD-10-CM | POA: Diagnosis not present

## 2019-02-25 ENCOUNTER — Other Ambulatory Visit: Payer: Self-pay | Admitting: Family Medicine

## 2019-02-25 DIAGNOSIS — E2839 Other primary ovarian failure: Secondary | ICD-10-CM

## 2019-02-25 DIAGNOSIS — M8580 Other specified disorders of bone density and structure, unspecified site: Secondary | ICD-10-CM

## 2019-02-26 DIAGNOSIS — M961 Postlaminectomy syndrome, not elsewhere classified: Secondary | ICD-10-CM | POA: Diagnosis not present

## 2019-02-26 DIAGNOSIS — Q761 Klippel-Feil syndrome: Secondary | ICD-10-CM | POA: Diagnosis not present

## 2019-03-11 DIAGNOSIS — H524 Presbyopia: Secondary | ICD-10-CM | POA: Diagnosis not present

## 2019-03-11 DIAGNOSIS — H2513 Age-related nuclear cataract, bilateral: Secondary | ICD-10-CM | POA: Diagnosis not present

## 2019-03-11 DIAGNOSIS — H5203 Hypermetropia, bilateral: Secondary | ICD-10-CM | POA: Diagnosis not present

## 2019-03-11 DIAGNOSIS — H35371 Puckering of macula, right eye: Secondary | ICD-10-CM | POA: Diagnosis not present

## 2019-04-14 DIAGNOSIS — M545 Low back pain: Secondary | ICD-10-CM | POA: Diagnosis not present

## 2019-04-14 DIAGNOSIS — M961 Postlaminectomy syndrome, not elsewhere classified: Secondary | ICD-10-CM | POA: Diagnosis not present

## 2019-04-22 DIAGNOSIS — G43009 Migraine without aura, not intractable, without status migrainosus: Secondary | ICD-10-CM | POA: Diagnosis not present

## 2019-04-22 DIAGNOSIS — E7800 Pure hypercholesterolemia, unspecified: Secondary | ICD-10-CM | POA: Diagnosis not present

## 2019-04-22 DIAGNOSIS — M8580 Other specified disorders of bone density and structure, unspecified site: Secondary | ICD-10-CM | POA: Diagnosis not present

## 2019-04-22 DIAGNOSIS — I10 Essential (primary) hypertension: Secondary | ICD-10-CM | POA: Diagnosis not present

## 2019-04-22 DIAGNOSIS — E039 Hypothyroidism, unspecified: Secondary | ICD-10-CM | POA: Diagnosis not present

## 2019-06-10 DIAGNOSIS — M545 Low back pain: Secondary | ICD-10-CM | POA: Diagnosis not present

## 2019-06-10 DIAGNOSIS — K635 Polyp of colon: Secondary | ICD-10-CM | POA: Diagnosis not present

## 2019-06-10 DIAGNOSIS — I10 Essential (primary) hypertension: Secondary | ICD-10-CM | POA: Diagnosis not present

## 2019-06-10 DIAGNOSIS — Z800 Family history of malignant neoplasm of digestive organs: Secondary | ICD-10-CM | POA: Diagnosis not present

## 2019-06-10 DIAGNOSIS — E559 Vitamin D deficiency, unspecified: Secondary | ICD-10-CM | POA: Diagnosis not present

## 2019-06-10 DIAGNOSIS — Z87891 Personal history of nicotine dependence: Secondary | ICD-10-CM | POA: Diagnosis not present

## 2019-06-10 DIAGNOSIS — E039 Hypothyroidism, unspecified: Secondary | ICD-10-CM | POA: Diagnosis not present

## 2019-06-10 DIAGNOSIS — R5383 Other fatigue: Secondary | ICD-10-CM | POA: Diagnosis not present

## 2019-06-10 DIAGNOSIS — Z803 Family history of malignant neoplasm of breast: Secondary | ICD-10-CM | POA: Diagnosis not present

## 2019-06-10 DIAGNOSIS — M549 Dorsalgia, unspecified: Secondary | ICD-10-CM | POA: Diagnosis not present

## 2019-06-10 DIAGNOSIS — F419 Anxiety disorder, unspecified: Secondary | ICD-10-CM | POA: Diagnosis not present

## 2019-06-10 DIAGNOSIS — E663 Overweight: Secondary | ICD-10-CM | POA: Diagnosis not present

## 2019-06-26 DIAGNOSIS — Z1231 Encounter for screening mammogram for malignant neoplasm of breast: Secondary | ICD-10-CM | POA: Diagnosis not present

## 2019-06-26 DIAGNOSIS — M8589 Other specified disorders of bone density and structure, multiple sites: Secondary | ICD-10-CM | POA: Diagnosis not present

## 2019-06-26 DIAGNOSIS — E559 Vitamin D deficiency, unspecified: Secondary | ICD-10-CM | POA: Diagnosis not present

## 2019-06-26 DIAGNOSIS — Z780 Asymptomatic menopausal state: Secondary | ICD-10-CM | POA: Diagnosis not present

## 2019-06-26 DIAGNOSIS — Z803 Family history of malignant neoplasm of breast: Secondary | ICD-10-CM | POA: Diagnosis not present

## 2019-07-02 DIAGNOSIS — G43009 Migraine without aura, not intractable, without status migrainosus: Secondary | ICD-10-CM | POA: Diagnosis not present

## 2019-07-02 DIAGNOSIS — E7800 Pure hypercholesterolemia, unspecified: Secondary | ICD-10-CM | POA: Diagnosis not present

## 2019-07-02 DIAGNOSIS — M8580 Other specified disorders of bone density and structure, unspecified site: Secondary | ICD-10-CM | POA: Diagnosis not present

## 2019-07-02 DIAGNOSIS — E039 Hypothyroidism, unspecified: Secondary | ICD-10-CM | POA: Diagnosis not present

## 2019-07-02 DIAGNOSIS — I10 Essential (primary) hypertension: Secondary | ICD-10-CM | POA: Diagnosis not present

## 2019-07-09 DIAGNOSIS — Z79891 Long term (current) use of opiate analgesic: Secondary | ICD-10-CM | POA: Diagnosis not present

## 2019-07-09 DIAGNOSIS — G894 Chronic pain syndrome: Secondary | ICD-10-CM | POA: Diagnosis not present

## 2019-07-09 DIAGNOSIS — M961 Postlaminectomy syndrome, not elsewhere classified: Secondary | ICD-10-CM | POA: Diagnosis not present

## 2019-07-09 DIAGNOSIS — M5416 Radiculopathy, lumbar region: Secondary | ICD-10-CM | POA: Diagnosis not present

## 2019-09-04 DIAGNOSIS — I10 Essential (primary) hypertension: Secondary | ICD-10-CM | POA: Diagnosis not present

## 2019-09-04 DIAGNOSIS — Z79899 Other long term (current) drug therapy: Secondary | ICD-10-CM | POA: Diagnosis not present

## 2019-09-04 DIAGNOSIS — E559 Vitamin D deficiency, unspecified: Secondary | ICD-10-CM | POA: Diagnosis not present

## 2019-09-04 DIAGNOSIS — E039 Hypothyroidism, unspecified: Secondary | ICD-10-CM | POA: Diagnosis not present

## 2019-09-04 DIAGNOSIS — R5383 Other fatigue: Secondary | ICD-10-CM | POA: Diagnosis not present

## 2019-09-11 DIAGNOSIS — R062 Wheezing: Secondary | ICD-10-CM | POA: Diagnosis not present

## 2019-09-11 DIAGNOSIS — G8929 Other chronic pain: Secondary | ICD-10-CM | POA: Diagnosis not present

## 2019-09-11 DIAGNOSIS — Z0389 Encounter for observation for other suspected diseases and conditions ruled out: Secondary | ICD-10-CM | POA: Diagnosis not present

## 2019-09-11 DIAGNOSIS — M545 Low back pain: Secondary | ICD-10-CM | POA: Diagnosis not present

## 2019-09-11 DIAGNOSIS — R05 Cough: Secondary | ICD-10-CM | POA: Diagnosis not present

## 2019-09-11 DIAGNOSIS — F17200 Nicotine dependence, unspecified, uncomplicated: Secondary | ICD-10-CM | POA: Diagnosis not present

## 2019-09-11 DIAGNOSIS — E7800 Pure hypercholesterolemia, unspecified: Secondary | ICD-10-CM | POA: Diagnosis not present

## 2019-09-11 DIAGNOSIS — E039 Hypothyroidism, unspecified: Secondary | ICD-10-CM | POA: Diagnosis not present

## 2019-09-11 DIAGNOSIS — R197 Diarrhea, unspecified: Secondary | ICD-10-CM | POA: Diagnosis not present

## 2019-09-12 DIAGNOSIS — R197 Diarrhea, unspecified: Secondary | ICD-10-CM | POA: Diagnosis not present

## 2019-10-01 DIAGNOSIS — N39498 Other specified urinary incontinence: Secondary | ICD-10-CM | POA: Diagnosis not present

## 2019-10-01 DIAGNOSIS — M961 Postlaminectomy syndrome, not elsewhere classified: Secondary | ICD-10-CM | POA: Diagnosis not present

## 2019-10-01 DIAGNOSIS — Z79891 Long term (current) use of opiate analgesic: Secondary | ICD-10-CM | POA: Diagnosis not present

## 2019-10-01 DIAGNOSIS — G894 Chronic pain syndrome: Secondary | ICD-10-CM | POA: Diagnosis not present

## 2019-10-01 DIAGNOSIS — M5416 Radiculopathy, lumbar region: Secondary | ICD-10-CM | POA: Diagnosis not present

## 2019-10-02 DIAGNOSIS — R32 Unspecified urinary incontinence: Secondary | ICD-10-CM | POA: Diagnosis not present

## 2019-10-02 DIAGNOSIS — R109 Unspecified abdominal pain: Secondary | ICD-10-CM | POA: Diagnosis not present

## 2019-10-02 DIAGNOSIS — M5136 Other intervertebral disc degeneration, lumbar region: Secondary | ICD-10-CM | POA: Diagnosis not present

## 2019-10-02 DIAGNOSIS — M549 Dorsalgia, unspecified: Secondary | ICD-10-CM | POA: Diagnosis not present

## 2019-10-02 DIAGNOSIS — Z87442 Personal history of urinary calculi: Secondary | ICD-10-CM | POA: Diagnosis not present

## 2019-10-02 DIAGNOSIS — N39498 Other specified urinary incontinence: Secondary | ICD-10-CM | POA: Diagnosis not present

## 2019-10-02 DIAGNOSIS — R3129 Other microscopic hematuria: Secondary | ICD-10-CM | POA: Diagnosis not present

## 2019-10-02 DIAGNOSIS — N200 Calculus of kidney: Secondary | ICD-10-CM | POA: Diagnosis not present

## 2019-10-09 DIAGNOSIS — R32 Unspecified urinary incontinence: Secondary | ICD-10-CM | POA: Diagnosis not present

## 2019-10-09 DIAGNOSIS — K5900 Constipation, unspecified: Secondary | ICD-10-CM | POA: Diagnosis not present

## 2019-10-09 DIAGNOSIS — R109 Unspecified abdominal pain: Secondary | ICD-10-CM | POA: Diagnosis not present

## 2019-10-09 DIAGNOSIS — R3129 Other microscopic hematuria: Secondary | ICD-10-CM | POA: Diagnosis not present

## 2019-10-09 DIAGNOSIS — Z87442 Personal history of urinary calculi: Secondary | ICD-10-CM | POA: Diagnosis not present

## 2019-10-09 DIAGNOSIS — M549 Dorsalgia, unspecified: Secondary | ICD-10-CM | POA: Diagnosis not present

## 2019-10-09 DIAGNOSIS — N200 Calculus of kidney: Secondary | ICD-10-CM | POA: Diagnosis not present

## 2019-10-14 DIAGNOSIS — J069 Acute upper respiratory infection, unspecified: Secondary | ICD-10-CM | POA: Diagnosis not present

## 2019-10-14 DIAGNOSIS — R319 Hematuria, unspecified: Secondary | ICD-10-CM | POA: Diagnosis not present

## 2019-10-14 DIAGNOSIS — R32 Unspecified urinary incontinence: Secondary | ICD-10-CM | POA: Diagnosis not present

## 2019-10-14 DIAGNOSIS — F17200 Nicotine dependence, unspecified, uncomplicated: Secondary | ICD-10-CM | POA: Diagnosis not present

## 2019-11-18 DIAGNOSIS — Z87448 Personal history of other diseases of urinary system: Secondary | ICD-10-CM | POA: Diagnosis not present

## 2019-11-18 DIAGNOSIS — N3941 Urge incontinence: Secondary | ICD-10-CM | POA: Diagnosis not present

## 2019-11-18 DIAGNOSIS — R109 Unspecified abdominal pain: Secondary | ICD-10-CM | POA: Diagnosis not present

## 2019-11-25 DIAGNOSIS — R109 Unspecified abdominal pain: Secondary | ICD-10-CM | POA: Diagnosis not present

## 2019-11-27 DIAGNOSIS — N39498 Other specified urinary incontinence: Secondary | ICD-10-CM | POA: Diagnosis not present

## 2019-11-27 DIAGNOSIS — M961 Postlaminectomy syndrome, not elsewhere classified: Secondary | ICD-10-CM | POA: Diagnosis not present

## 2019-11-27 DIAGNOSIS — G894 Chronic pain syndrome: Secondary | ICD-10-CM | POA: Diagnosis not present

## 2019-11-27 DIAGNOSIS — Z79891 Long term (current) use of opiate analgesic: Secondary | ICD-10-CM | POA: Diagnosis not present

## 2019-11-27 DIAGNOSIS — M5416 Radiculopathy, lumbar region: Secondary | ICD-10-CM | POA: Diagnosis not present

## 2019-12-23 DIAGNOSIS — N3941 Urge incontinence: Secondary | ICD-10-CM | POA: Diagnosis not present

## 2019-12-23 DIAGNOSIS — Z87448 Personal history of other diseases of urinary system: Secondary | ICD-10-CM | POA: Diagnosis not present

## 2020-01-09 DIAGNOSIS — Z01812 Encounter for preprocedural laboratory examination: Secondary | ICD-10-CM | POA: Diagnosis not present

## 2020-01-09 DIAGNOSIS — Z20822 Contact with and (suspected) exposure to covid-19: Secondary | ICD-10-CM | POA: Diagnosis not present

## 2020-01-14 DIAGNOSIS — E7800 Pure hypercholesterolemia, unspecified: Secondary | ICD-10-CM | POA: Diagnosis not present

## 2020-01-14 DIAGNOSIS — K640 First degree hemorrhoids: Secondary | ICD-10-CM | POA: Diagnosis not present

## 2020-01-14 DIAGNOSIS — Z86010 Personal history of colonic polyps: Secondary | ICD-10-CM | POA: Diagnosis not present

## 2020-01-14 DIAGNOSIS — Z1211 Encounter for screening for malignant neoplasm of colon: Secondary | ICD-10-CM | POA: Diagnosis not present

## 2020-01-14 DIAGNOSIS — K52831 Collagenous colitis: Secondary | ICD-10-CM | POA: Diagnosis not present

## 2020-01-14 DIAGNOSIS — Z800 Family history of malignant neoplasm of digestive organs: Secondary | ICD-10-CM | POA: Diagnosis not present

## 2020-01-14 DIAGNOSIS — F17200 Nicotine dependence, unspecified, uncomplicated: Secondary | ICD-10-CM | POA: Diagnosis not present

## 2020-01-14 DIAGNOSIS — K5730 Diverticulosis of large intestine without perforation or abscess without bleeding: Secondary | ICD-10-CM | POA: Diagnosis not present

## 2020-01-14 DIAGNOSIS — I10 Essential (primary) hypertension: Secondary | ICD-10-CM | POA: Diagnosis not present

## 2020-01-14 DIAGNOSIS — D127 Benign neoplasm of rectosigmoid junction: Secondary | ICD-10-CM | POA: Diagnosis not present

## 2020-01-14 DIAGNOSIS — F419 Anxiety disorder, unspecified: Secondary | ICD-10-CM | POA: Diagnosis not present

## 2020-01-14 DIAGNOSIS — K649 Unspecified hemorrhoids: Secondary | ICD-10-CM | POA: Diagnosis not present

## 2020-01-14 DIAGNOSIS — K635 Polyp of colon: Secondary | ICD-10-CM | POA: Diagnosis not present

## 2020-01-14 DIAGNOSIS — R002 Palpitations: Secondary | ICD-10-CM | POA: Diagnosis not present

## 2020-01-14 DIAGNOSIS — E039 Hypothyroidism, unspecified: Secondary | ICD-10-CM | POA: Diagnosis not present

## 2020-01-14 DIAGNOSIS — E785 Hyperlipidemia, unspecified: Secondary | ICD-10-CM | POA: Diagnosis not present

## 2020-01-14 DIAGNOSIS — E559 Vitamin D deficiency, unspecified: Secondary | ICD-10-CM | POA: Diagnosis not present

## 2020-01-22 DIAGNOSIS — E039 Hypothyroidism, unspecified: Secondary | ICD-10-CM | POA: Diagnosis not present
